# Patient Record
Sex: Male | Born: 1965 | Race: White | Hispanic: No | Marital: Married | Smoking: Never smoker
Health system: Southern US, Community
[De-identification: ages and names within clinical notes are randomized; demographics above are authoritative.]

## PROBLEM LIST (undated history)

## (undated) DIAGNOSIS — T7840XA Allergy, unspecified, initial encounter: Secondary | ICD-10-CM

## (undated) DIAGNOSIS — E785 Hyperlipidemia, unspecified: Secondary | ICD-10-CM

## (undated) DIAGNOSIS — Z0000 Encounter for general adult medical examination without abnormal findings: Secondary | ICD-10-CM

## (undated) DIAGNOSIS — H109 Unspecified conjunctivitis: Secondary | ICD-10-CM

## (undated) DIAGNOSIS — E669 Obesity, unspecified: Secondary | ICD-10-CM

## (undated) DIAGNOSIS — K439 Ventral hernia without obstruction or gangrene: Secondary | ICD-10-CM

## (undated) DIAGNOSIS — K219 Gastro-esophageal reflux disease without esophagitis: Secondary | ICD-10-CM

## (undated) DIAGNOSIS — M7711 Lateral epicondylitis, right elbow: Secondary | ICD-10-CM

## (undated) HISTORY — DX: Gastro-esophageal reflux disease without esophagitis: K21.9

## (undated) HISTORY — PX: WISDOM TOOTH EXTRACTION: SHX21

## (undated) HISTORY — DX: Unspecified conjunctivitis: H10.9

## (undated) HISTORY — DX: Ventral hernia without obstruction or gangrene: K43.9

## (undated) HISTORY — DX: Lateral epicondylitis, right elbow: M77.11

## (undated) HISTORY — PX: HERNIA REPAIR: SHX51

## (undated) HISTORY — DX: Hyperlipidemia, unspecified: E78.5

## (undated) HISTORY — DX: Allergy, unspecified, initial encounter: T78.40XA

## (undated) HISTORY — DX: Obesity, unspecified: E66.9

## (undated) HISTORY — DX: Encounter for general adult medical examination without abnormal findings: Z00.00

---

## 2007-11-26 ENCOUNTER — Observation Stay (HOSPITAL_COMMUNITY): Admission: AD | Admit: 2007-11-26 | Discharge: 2007-11-28 | Payer: Self-pay | Admitting: Internal Medicine

## 2007-11-26 ENCOUNTER — Encounter: Payer: Self-pay | Admitting: Emergency Medicine

## 2007-11-27 ENCOUNTER — Encounter (INDEPENDENT_AMBULATORY_CARE_PROVIDER_SITE_OTHER): Payer: Self-pay | Admitting: Surgery

## 2008-02-27 HISTORY — PX: CHOLECYSTECTOMY: SHX55

## 2010-07-11 NOTE — Consult Note (Signed)
Jack Stuart, Jack Stuart              ACCOUNT NO.:  [id number]   MEDICAL RECORD NO.:  [id number]          PATIENT TYPE:  INP   LOCATION:  4713                         FACILITY:  [facility]   PHYSICIAN:  Maisie Fus A. Cornett, M.D.DATE OF BIRTH:  [birth year]   DATE OF CONSULTATION:  11/26/2007  DATE OF DISCHARGE:                                 CONSULTATION   REQUESTING PHYSICIAN:  Renee Ramus, MD.   REASON FOR CONSULTATION:  Cholecystitis.   HISTORY OF PRESENT ILLNESS:  Mr. Russman is a 45 year old white male  who has a past medical history of GERD, who woke up approximately at  3:30 this morning with right upper quadrant abdominal pain.  At this  time, he did have associated nausea, however, no vomiting.  At that  time, he presented to the emergency department where he was admitted and  cardiac enzymes were taken which were negative.  At that time, then he  had an abdominal ultrasound completed which showed mild gallbladder wall  thickening, no common bile duct dilatation, and no pericholecystic  fluid.  It did show multiple gallstones, however.  At that time, this  patient was admitted to Medical Service and we were later consulted for  surgical intervention.   REVIEW OF SYSTEMS:  Please see HPI.  Otherwise all other systems are  negative.   FAMILY HISTORY:  Noncontributory.   PAST MEDICAL HISTORY:  1. Gastroesophageal reflux disease.  2. Sinus bradycardia.   PAST SURGICAL HISTORY:  Left inguinal hernia repair as a child.   SOCIAL HISTORY:  The patient does not smoke or drink any alcohol.  He is  married with one 19-year-old child and his wife is currently 8 months  pregnant.   ALLERGIES:  NKDA.   MEDICATIONS:  Nexium 40 mg a day.   PHYSICAL EXAMINATION:  GENERAL:  Mr. Pitter is a 45 year old white  male who is currently lying in bed in no acute distress.  VITAL SIGNS:  Temperature 97.5, blood pressure 111/71, pulse 52, and  respirations 20.  EYES:  Sclerae noninjected.   Pupils are equal, round, and reactive to  light.  EARS, NOSE, MOUTH, AND THROAT:  Ears and nose without any obvious masses  or lesions.  No rhinorrhea.  Mouth is pink and moist.  Throat shows no  exudate.  NECK:  Supple.  Trachea is midline.  No thyromegaly.  LUNGS:  Clear to auscultation bilaterally with no wheezes, rhonchi, or  rales noted.  Respiratory effort is nonlabored.  HEART:  Regular rate and rhythm.  Normal S1 and S2.  No murmurs,  gallops, or rubs are noted.  +2 radial, pedal, and carotid pulses  bilaterally.  ABDOMEN:  Soft with some mild right upper quadrant tenderness with a  positive Murphy sign.  Otherwise, the patient is nondistended with  active bowel sounds.  No other obvious masses or hernias are noted in  the abdomen.  MUSCULOSKELETAL:  All 4 extremities are symmetrical with no cyanosis,  clubbing, or edema.  SKIN:  Warm and dry without any obvious masses, lesions, or rashes.  NEUROLOGIC:  Cranial nerves II through XII appeared to  be grossly  intact.  PSYCH:  The patient is alert and oriented x3 with an appropriate affect.   LABORATORY AND DIAGNOSTIC DATA:  White blood cell count 9500, hemoglobin  14.9, hematocrit 42.8, and platelet count 254,000.  Sodium 139,  potassium 3.8, glucose 148, BUN 15, and creatinine 1.0.  AST 24, ALT 73,  alkaline phosphatase 67, total bilirubin 0.7, amylase is 103, and lipase  is 128.  Diagnostic ultrasound of the abdomen revealed mild gallbladder  wall distention with multiple gallstones, however, no gallstones were  seen in the common bile duct and there was no common bile duct  dilatation.  A chest x-ray was negative.   IMPRESSION:  1. Biliary colic.  2. Questionable acute cholecystitis.  3. Cholelithiasis.  4. Gastroesophageal reflux disease.   PLAN:  At this time, the patient's pain has dramatically improved.  Upon  his admission, he has been made aware that his wife who is 8 months  pregnant has been diagnosed with  thrombophlebitis and is also being  admitted to [facility] for heparin drip.  Therefore, the patient who is  as stated earlier beginning to feel better is contemplating whether he  would rather go ahead and get the surgery done now or if he needs to  wait.  Then electively he says that he can help take care of his wife as  well as his 76-year-old kid.  At this time, we have left this decision up  to the patient.  After I left the room, he is supposed to call his wife  to further make a decision.  In the meantime, if the patient does chose  to stay, we will give him a low-fat diet tonight and then make him  n.p.o. after midnight in preparation for a laparoscopic cholecystectomy  tomorrow morning.  If this is the case, we will give him Cipro IV on  call to the OR.  Otherwise, we will start Lovenox for DVT prophylaxis.  The patient will also get various other p.r.n. medications for nausea  and pain if needed.  However, as of right now, the patient has actually  not needed any pain medicines and as stated earlier he is already  beginning to feel better.  If the patient chooses to hold off on surgery  to a later, we can give him approximately a week of antibiotics and pain  medicines as needed to help with his attack now and help prevent another  attack from occurring until he can get his gallbladder taken out  electively.  Therefore, due to this dilemma, I do not have a more  definite plan at this time.  However, once the patient and his wife have  decided, we will proceed either way and we are okay with whatever  decision he makes.      Letha Cape, PA      Maisie Fus A. Cornett, M.D.  Electronically Signed    KEO/MEDQ  D:  11/26/2007  T:  11/27/2007  Job:  [id number]

## 2010-07-11 NOTE — Op Note (Signed)
Jack Stuart, Jack Stuart              ACCOUNT NO.:  [id number]   MEDICAL RECORD NO.:  [id number]          PATIENT TYPE:  INP   LOCATION:  5120                         FACILITY:  [facility]   PHYSICIAN:  Maisie Fus A. Cornett, M.D.DATE OF BIRTH:  [birth year]   DATE OF PROCEDURE:  11/27/2007  DATE OF DISCHARGE:                               OPERATIVE REPORT   PREOPERATIVE DIAGNOSIS:  Acute cholecystitis.   POSTOPERATIVE DIAGNOSIS:  Acute cholecystitis.   PROCEDURE:  Laparoscopic cholecystectomy with intraoperative  cholangiogram.   SURGEON:  Thomas A. Cornett, MD   ASSISTANT:  Kelle Darting. Rennis Harding, NP   ESTIMATED BLOOD LOSS:  100 mL.   DRAINS:  None.   ANESTHESIA:  General endotracheal anesthesia with 0.25% Sensorcaine.   SPECIMEN:  Gallbladder with gallstones to pathology.   INDICATIONS FOR PROCEDURE:  The patient is a 45 year old male who  presents today with acute cholecystitis.  I was asked to see him at the  request of Dr. Janice Norrie for this.  Upon workup, he did indeed have acute  cholecystitis and I recommend laparoscopic cholecystectomy with him  after discussing the surgical options.  He did not have any nonsurgical  options for this given that he had acute cholecystitis.  Risk of  bleeding, infection, common duct injury, and injury to other organs  including the gallbladder, liver, stomach, small bowel, and colon were  all discussed.  He understood and agreed to proceed.   DESCRIPTION OF PROCEDURE:  The patient was brought to the operating room  and placed supine.  After induction of general anesthesia, the abdomen  was prepped and draped in a sterile fashion.  He received perioperative  antibiotics.  Incision was made just below his umbilicus of about 2 cm.  Dissection was carried down to his fascia.  His fascia was opened and a  purse-string suture of 0-Vicryl was placed.  The peritoneal lining was  opened with hemostat and direct vision.  A 12-mm Hasson cannula was  placed under  direct vision and pneumoperitoneum was then created at 15  mmHg of CO2.  He was placed in reverse Trendelenburg and rolled to his  left.  After placement of this, a laparoscopy was then performed.  There  was evidence of acute cholecystitis.  No other abnormality noted.   A subxiphoid port was placed using a 11-mm trocar.  Two 5-mm ports were  placed in the right upper quadrant, all under direct vision.  Gallbladder was identified, grabbed by its dome, and retracted to the  patient's right shoulder.  Second grasper was used to grab the  infundibulum port for the patient's right lower quadrant.  Dissection  was begun at the junction of the cystic duct and infundibulum.  There  were significant fatty tissue around the infundibulum.  I was able to  dissect out the cystic duct.  Clips were placed in the gallbladder side  and a small incision was made for intraoperative cholangiogram.  A Cook  cholangiogram catheter was then placed in the cystic duct which was held  in place by clips.  Cholangiogram revealed that the tip of the catheter  was actually in the very bottom of the gallbladder.  This was then  tapered to a cystic duct which free flowed into the common duct and into  the duodenum.  There was free flow of contrast up the common hepatic  duct and the right left ductules.  We then removed the catheter.  We  placed clips on the cystic duct and divided it at this point in time.  Of note, there was some extravasation during the cholangiogram around  the catheter.  At this point, the cystic artery was identified.  I took  the individual branches up on the gallbladder itself with clips and  cautery.  We then removed the gallbladder from the gallbladder fossa  without difficulty.  There was significant fatty tissue around the  gallbladder though noted.  Gallbladder was placed in an EndoCatch bag.  Irrigate at the gallbladder bed found to be hemostatic without evidence  of bleeding or bile  leakage.  Excess irrigation was suctioned out.  We  then put the scope in subxiphoid port and extract in the gallbladder.  I  closed the fascia with a purse-string suture.  We reinspected the  gallbladder bed and right upper quadrant and saw no evidence of bleeding  or bile leakage.  We suctioned out any excess irrigation and this was  clear.  I did not see any evidence of colon injury, small bowel injury,  or other injury to liver or other intra-abdominal structures.  At this  point in time, we allowed the CO2 to escape.  Removed our ports and the  port sites showed no evidence of bleeding.  The skin was then closed  with 4-0 Monocryl.  Umbilical port site closed with the 0-Vicryl and 4-0  Monocryl.  Dermabond was applied.  All final counts of sponge, needle,  and instrument were found to be correct.  The patient was, however,  taken to recovery in satisfactory condition.      Thomas A. Cornett, M.D.  Electronically Signed     TAC/MEDQ  D:  11/27/2007  T:  11/28/2007  Job:  [id number]   cc:   Renee Ramus, MD

## 2010-11-27 LAB — COMPREHENSIVE METABOLIC PANEL
AST: 19
Albumin: 3.7
Albumin: 4.4
Alkaline Phosphatase: 67
BUN: 10
CO2: 26
CO2: 29
Calcium: 9.4
Chloride: 104
Creatinine, Ser: 0.87
GFR calc Af Amer: >60
GFR calc non Af Amer: >60
GFR calc non Af Amer: >60
Glucose, Bld: 142 — ABNORMAL HIGH
Glucose, Bld: 92
Sodium: 139
Sodium: 139
Total Bilirubin: 1.1
Total Protein: 7.3

## 2010-11-27 LAB — CBC
MCV: 90.1
Platelets: 254
RBC: 4.76
WBC: 9.5

## 2010-11-27 LAB — LIPID PANEL
Cholesterol: 217 — ABNORMAL HIGH
LDL Cholesterol: 167 — ABNORMAL HIGH
Triglycerides: 87

## 2010-11-27 LAB — CARDIAC PANEL(CRET KIN+CKTOT+MB+TROPI)
CK, MB: 0.8
CK, MB: 0.8
Relative Index: INVALID
Total CK: 82
Troponin I: <0.01
Troponin I: <0.01

## 2010-11-27 LAB — POCT CARDIAC MARKERS: CKMB, poc: 1.1

## 2010-11-27 LAB — DIFFERENTIAL
Basophils Absolute: 0.1
Basophils Relative: 1
Lymphocytes Relative: 23
Monocytes Absolute: 0.6
Neutrophils Relative %: 69

## 2010-11-27 LAB — C-REACTIVE PROTEIN: CRP: 0.1 — ABNORMAL LOW (ref <0.6)

## 2010-11-27 LAB — TSH: TSH: 1.017

## 2010-11-28 LAB — COMPREHENSIVE METABOLIC PANEL
ALT: 69 — ABNORMAL HIGH
BUN: 8
Chloride: 103
Creatinine, Ser: 1.05
GFR calc Af Amer: >60
Glucose, Bld: 123 — ABNORMAL HIGH
Potassium: 3.7
Sodium: 135
Total Bilirubin: 2.2 — ABNORMAL HIGH

## 2010-11-28 LAB — CBC
HCT: 40.1
Hemoglobin: 13.9
RDW: 12.9

## 2012-03-26 ENCOUNTER — Ambulatory Visit (INDEPENDENT_AMBULATORY_CARE_PROVIDER_SITE_OTHER): Payer: 59 | Admitting: Family Medicine

## 2012-03-26 ENCOUNTER — Other Ambulatory Visit: Payer: Self-pay | Admitting: Family Medicine

## 2012-03-26 ENCOUNTER — Encounter: Payer: Self-pay | Admitting: Family Medicine

## 2012-03-26 VITALS — BP 112/78 | HR 89 | Temp 98.4°F | Ht 74.0 in | Wt 205.0 lb

## 2012-03-26 DIAGNOSIS — E785 Hyperlipidemia, unspecified: Secondary | ICD-10-CM

## 2012-03-26 DIAGNOSIS — K219 Gastro-esophageal reflux disease without esophagitis: Secondary | ICD-10-CM

## 2012-03-26 DIAGNOSIS — K439 Ventral hernia without obstruction or gangrene: Secondary | ICD-10-CM

## 2012-03-26 DIAGNOSIS — R0989 Other specified symptoms and signs involving the circulatory and respiratory systems: Secondary | ICD-10-CM

## 2012-03-26 DIAGNOSIS — R0683 Snoring: Secondary | ICD-10-CM | POA: Insufficient documentation

## 2012-03-26 DIAGNOSIS — Z23 Encounter for immunization: Secondary | ICD-10-CM

## 2012-03-26 DIAGNOSIS — Z0000 Encounter for general adult medical examination without abnormal findings: Secondary | ICD-10-CM

## 2012-03-26 DIAGNOSIS — R197 Diarrhea, unspecified: Secondary | ICD-10-CM

## 2012-03-26 HISTORY — DX: Ventral hernia without obstruction or gangrene: K43.9

## 2012-03-26 HISTORY — DX: Encounter for general adult medical examination without abnormal findings: Z00.00

## 2012-03-26 LAB — LIPID PANEL
HDL: 38 mg/dL — ABNORMAL LOW (ref >39)
LDL Cholesterol: 140 mg/dL — ABNORMAL HIGH (ref 0–99)
Triglycerides: 175 mg/dL — ABNORMAL HIGH (ref <150)
VLDL: 35 mg/dL (ref 0–40)

## 2012-03-26 LAB — PHOSPHORUS: Phosphorus: 3.2 mg/dL (ref 2.3–4.6)

## 2012-03-26 LAB — CBC
MCH: 31.5 pg (ref 26.0–34.0)
MCHC: 35.3 g/dL (ref 30.0–36.0)
Platelets: 247 10*3/uL (ref 150–400)

## 2012-03-26 LAB — BASIC METABOLIC PANEL
Calcium: 9.5 mg/dL (ref 8.4–10.5)
Sodium: 137 mEq/L (ref 135–145)

## 2012-03-26 LAB — HEPATIC FUNCTION PANEL
ALT: 23 U/L (ref 0–53)
Bilirubin, Direct: 0.2 mg/dL (ref 0.0–0.3)
Indirect Bilirubin: 1 mg/dL — ABNORMAL HIGH (ref 0.0–0.9)

## 2012-03-26 NOTE — Assessment & Plan Note (Signed)
Has not seen an MD in many years, will check fasting labs and given a flu shot, consider Tdap at next visit.

## 2012-03-26 NOTE — Assessment & Plan Note (Signed)
Add Metamucil and probiotics, may continue Imodium prn. Consider pancreatic enzymes if diarrhea persists.

## 2012-03-26 NOTE — Patient Instructions (Addendum)
Krill oil caps daily, MegaRed caps daily Probiotic Digestive Advantage by Schiff daily Fiber, Metamucil, Benefiber are helpful   Preventive Care for Adults, Male A healthy lifestyle and preventive care can promote health and wellness. Preventive health guidelines for men include the following key practices:  A routine yearly physical is a good way to check with your caregiver about your health and preventative screening. It is a chance to share any concerns and updates on your health, and to receive a thorough exam.  Visit your dentist for a routine exam and preventative care every 6 months. Brush your teeth twice a day and floss once a day. Good oral hygiene prevents tooth decay and gum disease.  The frequency of eye exams is based on your age, health, family medical history, use of contact lenses, and other factors. Follow your caregiver's recommendations for frequency of eye exams.  Eat a healthy diet. Foods like vegetables, fruits, whole grains, low-fat dairy products, and lean protein foods contain the nutrients you need without too many calories. Decrease your intake of foods high in solid fats, added sugars, and salt. Eat the right amount of calories for you.Get information about a proper diet from your caregiver, if necessary.  Regular physical exercise is one of the most important things you can do for your health. Most adults should get at least 150 minutes of moderate-intensity exercise (any activity that increases your heart rate and causes you to sweat) each week. In addition, most adults need muscle-strengthening exercises on 2 or more days a week.  Maintain a healthy weight. The body mass index (BMI) is a screening tool to identify possible weight problems. It provides an estimate of body fat based on height and weight. Your caregiver can help determine your BMI, and can help you achieve or maintain a healthy weight.For adults 20 years and older:  A BMI below 18.5 is considered  underweight.  A BMI of 18.5 to 24.9 is normal.  A BMI of 25 to 29.9 is considered overweight.  A BMI of 30 and above is considered obese.  Maintain normal blood lipids and cholesterol levels by exercising and minimizing your intake of saturated fat. Eat a balanced diet with plenty of fruit and vegetables. Blood tests for lipids and cholesterol should begin at age 89 and be repeated every 5 years. If your lipid or cholesterol levels are high, you are over 50, or you are a high risk for heart disease, you may need your cholesterol levels checked more frequently.Ongoing high lipid and cholesterol levels should be treated with medicines if diet and exercise are not effective.  If you smoke, find out from your caregiver how to quit. If you do not use tobacco, do not start.  If you choose to drink alcohol, do not exceed 2 drinks per day. One drink is considered to be 12 ounces (355 mL) of beer, 5 ounces (148 mL) of wine, or 1.5 ounces (44 mL) of liquor.  Avoid use of street drugs. Do not share needles with anyone. Ask for help if you need support or instructions about stopping the use of drugs.  High blood pressure causes heart disease and increases the risk of stroke. Your blood pressure should be checked at least every 1 to 2 years. Ongoing high blood pressure should be treated with medicines, if weight loss and exercise are not effective.  If you are 71 to 49 years old, ask your caregiver if you should take aspirin to prevent heart disease.  Diabetes screening  involves taking a blood sample to check your fasting blood sugar level. This should be done once every 3 years, after age 55, if you are within normal weight and without risk factors for diabetes. Testing should be considered at a younger age or be carried out more frequently if you are overweight and have at least 1 risk factor for diabetes.  Colorectal cancer can be detected and often prevented. Most routine colorectal cancer screening  begins at the age of 72 and continues through age 29. However, your caregiver may recommend screening at an earlier age if you have risk factors for colon cancer. On a yearly basis, your caregiver may provide home test kits to check for hidden blood in the stool. Use of a small camera at the end of a tube, to directly examine the colon (sigmoidoscopy or colonoscopy), can detect the earliest forms of colorectal cancer. Talk to your caregiver about this at age 8, when routine screening begins. Direct examination of the colon should be repeated every 5 to 10 years through age 55, unless early forms of pre-cancerous polyps or small growths are found.  Hepatitis C blood testing is recommended for all people born from [birth year] through 1965 and any individual with known risks for hepatitis C.  Practice safe sex. Use condoms and avoid high-risk sexual practices to reduce the spread of sexually transmitted infections (STIs). STIs include gonorrhea, chlamydia, syphilis, trichomonas, herpes, HPV, and human immunodeficiency virus (HIV). Herpes, HIV, and HPV are viral illnesses that have no cure. They can result in disability, cancer, and death.  A one-time screening for abdominal aortic aneurysm (AAA) and surgical repair of large AAAs by sound wave imaging (ultrasonography) is recommended for ages 65 to 12 years who are current or former smokers.  Healthy men should no longer receive prostate-specific antigen (PSA) blood tests as part of routine cancer screening. Consult with your caregiver about prostate cancer screening.  Testicular cancer screening is not recommended for adult males who have no symptoms. Screening includes self-exam, caregiver exam, and other screening tests. Consult with your caregiver about any symptoms you have or any concerns you have about testicular cancer.  Use sunscreen with skin protection factor (SPF) of 30 or more. Apply sunscreen liberally and repeatedly throughout the day. You should  seek shade when your shadow is shorter than you. Protect yourself by wearing long sleeves, pants, a wide-brimmed hat, and sunglasses year round, whenever you are outdoors.  Once a month, do a whole body skin exam, using a mirror to look at the skin on your back. Notify your caregiver of new moles, moles that have irregular borders, moles that are larger than a pencil eraser, or moles that have changed in shape or color.  Stay current with required immunizations.  Influenza. You need a dose every fall (or winter). The composition of the flu vaccine changes each year, so being vaccinated once is not enough.  Pneumococcal polysaccharide. You need 1 to 2 doses if you smoke cigarettes or if you have certain chronic medical conditions. You need 1 dose at age 31 (or older) if you have never been vaccinated.  Tetanus, diphtheria, pertussis (Tdap, Td). Get 1 dose of Tdap vaccine if you are younger than age 71 years, are over 79 and have contact with an infant, are a Research scientist (physical sciences), or simply want to be protected from whooping cough. After that, you need a Td booster dose every 10 years. Consult your caregiver if you have not had at least 3 tetanus  and diphtheria-containing shots sometime in your life or have a deep or dirty wound.  HPV. This vaccine is recommended for males 13 through 49 years of age. This vaccine may be given to men 22 through 49 years of age who have not completed the 3 dose series. It is recommended for men through age 65 who have sex with men or whose immune system is weakened because of HIV infection, other illness, or medications. The vaccine is given in 3 doses over 6 months.  Measles, mumps, rubella (MMR). You need at least 1 dose of MMR if you were born in 1957 or later. You may also need a 2nd dose.  Meningococcal. If you are age 100 to 34 years and a Orthoptist living in a residence hall, or have one of several medical conditions, you need to get vaccinated  against meningococcal disease. You may also need additional booster doses.  Zoster (shingles). If you are age 57 years or older, you should get this vaccine.  Varicella (chickenpox). If you have never had chickenpox or you were vaccinated but received only 1 dose, talk to your caregiver to find out if you need this vaccine.  Hepatitis A. You need this vaccine if you have a specific risk factor for hepatitis A virus infection, or you simply wish to be protected from this disease. The vaccine is usually given as 2 doses, 6 to 18 months apart.  Hepatitis B. You need this vaccine if you have a specific risk factor for hepatitis B virus infection or you simply wish to be protected from this disease. The vaccine is given in 3 doses, usually over 6 months. Preventative Service / Frequency Ages 22 to 47  Blood pressure check.** / Every 1 to 2 years.  Lipid and cholesterol check.** / Every 5 years beginning at age 61.  Hepatitis C blood test.** / For any individual with known risks for hepatitis C.  Skin self-exam. / Monthly.  Influenza immunization.** / Every year.  Pneumococcal polysaccharide immunization.** / 1 to 2 doses if you smoke cigarettes or if you have certain chronic medical conditions.  Tetanus, diphtheria, pertussis (Tdap,Td) immunization. / A one-time dose of Tdap vaccine. After that, you need a Td booster dose every 10 years.  HPV immunization. / 3 doses over 6 months, if 26 and younger.  Measles, mumps, rubella (MMR) immunization. / You need at least 1 dose of MMR if you were born in 1957 or later. You may also need a 2nd dose.  Meningococcal immunization. / 1 dose if you are age 19 to 21 years and a Orthoptist living in a residence hall, or have one of several medical conditions, you need to get vaccinated against meningococcal disease. You may also need additional booster doses.  Varicella immunization.** / Consult your caregiver.  Hepatitis A  immunization.** / Consult your caregiver. 2 doses, 6 to 18 months apart.  Hepatitis B immunization.** / Consult your caregiver. 3 doses usually over 6 months. Ages 63 to 48  Blood pressure check.** / Every 1 to 2 years.  Lipid and cholesterol check.** / Every 5 years beginning at age 47.  Fecal occult blood test (FOBT) of stool. / Every year beginning at age 61 and continuing until age 44. You may not have to do this test if you get colonoscopy every 10 years.  Flexible sigmoidoscopy** or colonoscopy.** / Every 5 years for a flexible sigmoidoscopy or every 10 years for a colonoscopy beginning at age 26 and  continuing until age 43.  Hepatitis C blood test.** / For all people born from [birth year] through 1965 and any individual with known risks for hepatitis C.  Skin self-exam. / Monthly.  Influenza immunization.** / Every year.  Pneumococcal polysaccharide immunization.** / 1 to 2 doses if you smoke cigarettes or if you have certain chronic medical conditions.  Tetanus, diphtheria, pertussis (Tdap/Td) immunization.** / A one-time dose of Tdap vaccine. After that, you need a Td booster dose every 10 years.  Measles, mumps, rubella (MMR) immunization. / You need at least 1 dose of MMR if you were born in 1957 or later. You may also need a 2nd dose.  Varicella immunization.**/ Consult your caregiver.  Meningococcal immunization.** / Consult your caregiver.  Hepatitis A immunization.** / Consult your caregiver. 2 doses, 6 to 18 months apart.  Hepatitis B immunization.** / Consult your caregiver. 3 doses, usually over 6 months. Ages 58 and over  Blood pressure check.** / Every 1 to 2 years.  Lipid and cholesterol check.**/ Every 5 years beginning at age 23.  Fecal occult blood test (FOBT) of stool. / Every year beginning at age 73 and continuing until age 33. You may not have to do this test if you get colonoscopy every 10 years.  Flexible sigmoidoscopy** or colonoscopy.** / Every 5  years for a flexible sigmoidoscopy or every 10 years for a colonoscopy beginning at age 76 and continuing until age 80.  Hepatitis C blood test.** / For all people born from [birth year] through 1967 and any individual with known risks for hepatitis C.  Abdominal aortic aneurysm (AAA) screening.** / A one-time screening for ages 44 to 63 years who are current or former smokers.  Skin self-exam. / Monthly.  Influenza immunization.** / Every year.  Pneumococcal polysaccharide immunization.** / 1 dose at age 82 (or older) if you have never been vaccinated.  Tetanus, diphtheria, pertussis (Tdap, Td) immunization. / A one-time dose of Tdap vaccine if you are over 65 and have contact with an infant, are a Research scientist (physical sciences), or simply want to be protected from whooping cough. After that, you need a Td booster dose every 10 years.  Varicella immunization. ** / Consult your caregiver.  Meningococcal immunization.** / Consult your caregiver.  Hepatitis A immunization. ** / Consult your caregiver. 2 doses, 6 to 18 months apart.  Hepatitis B immunization.** / Check with your caregiver. 3 doses, usually over 6 months. **Family history and personal history of risk and conditions may change your caregiver's recommendations. Document Released: 04/10/2001 Document Revised: 05/07/2011 Document Reviewed: 07/10/2010 [facility] Patient Information 2013 [place], [place].

## 2012-03-26 NOTE — Assessment & Plan Note (Signed)
Consider sleep study, does not believe that he stops breathing, he will check with his wife and call back if so. Does struggle with fatigue

## 2012-03-26 NOTE — Progress Notes (Signed)
Patient ID: Jack Stuart, male   DOB: March 09, 1965, 47 y.o.   MRN: [id number] Odas Ozer [id number] February 07, 1966 03/26/2012      Progress Note New Patient  Subjective  Chief Complaint  Chief Complaint  Patient presents with  . Establish Care    new patient    HPI  Patient is a 47 year old Caucasian male who is in today for new patient appointment. She's not had a primary care doctor in many years. He is generally in good health but does have a few concerns. He notes having a cholecystectomy roughly 4 years ago and he's been struggling with diarrhea ever since. He notes he has numerous loose stool daily. Takes a good deal of Imodium. Does not seem to be a pattern with certain foods. No bloody or tarry stool. He also notes having trouble with intermittent epigastric pain and what feels like a hernia especially with heavy lifting and strenuous exercise ever since his surgery. He says at times it is tender and keeps him from exercising. He struggles with fatigue as well and notes that he does snore heavily. He does not believe he has actinic episodes. Denies headaches, chest pain, palpitations, shortness of breath, GI or GU concerns otherwise.  Past Medical History  Diagnosis Date  . Chicken pox as a child  . Measles as a child  . Hyperlipidemia   . Diarrhea 03/26/2012  . Snoring 03/26/2012  . Preventative health care 03/26/2012  . Abdominal wall hernia 03/26/2012  . GERD (gastroesophageal reflux disease)     Past Surgical History  Procedure Date  . Cholecystectomy 2010  . Hernia repair     as a kid    Family History  Problem Relation Age of Onset  . Heart disease Mother   . Diabetes Mother     type 2  . Glaucoma Mother   . Heart disease Father 57    open heart surgery  . Hyperlipidemia Father   . Heart disease Maternal Grandmother   . Heart disease Paternal Grandmother   . Heart disease Paternal Grandfather   . Hyperlipidemia Sister   . Hyperlipidemia Sister     History    Social History  . Marital Status: Married    Spouse Name: N/A    Number of Children: N/A  . Years of Education: N/A   Occupational History  . Not on file.   Social History Main Topics  . Smoking status: Never Smoker   . Smokeless tobacco: Never Used  . Alcohol Use: Yes     Comment: socially  . Drug Use: No  . Sexually Active: Yes   Other Topics Concern  . Not on file   Social History Narrative  . No narrative on file    Current Outpatient Prescriptions on File Prior to Visit  Medication Sig Dispense Refill  . Calcium-Magnesium-Vitamin D (CALCIUM 500 PO) Take by mouth.      Marland Kitchen omeprazole (PRILOSEC) 20 MG capsule Take 20 mg by mouth daily.        No Known Allergies  Review of Systems  Review of Systems  Constitutional: Negative for fever and malaise/fatigue.  HENT: Negative for congestion.   Eyes: Negative for discharge.  Respiratory: Negative for shortness of breath.   Cardiovascular: Negative for chest pain, palpitations and leg swelling.  Gastrointestinal: Negative for nausea, abdominal pain and diarrhea.  Genitourinary: Negative for dysuria.  Musculoskeletal: Negative for falls.  Skin: Negative for rash.  Neurological: Negative for loss of consciousness and headaches.  Endo/Heme/Allergies: Negative  for polydipsia.  Psychiatric/Behavioral: Negative for depression and suicidal ideas. The patient is not nervous/anxious and does not have insomnia.     Objective  BP 112/78  Pulse 89  Temp 98.4 F (36.9 C) (Temporal)  Ht 6\' 2"  (1.88 m)  Wt 205 lb (92.987 kg)  BMI 26.32 kg/m2  SpO2 98%  Physical Exam  Physical Exam  Constitutional: He is oriented to person, place, and time and well-developed, well-nourished, and in no distress. No distress.  HENT:  Head: Normocephalic and atraumatic.  Eyes: Conjunctivae normal are normal.  Neck: Neck supple. No thyromegaly present.  Cardiovascular: Normal rate, regular rhythm and normal heart sounds.   No murmur  heard. Pulmonary/Chest: Effort normal and breath sounds normal. No respiratory distress.  Abdominal: Soft. Bowel sounds are normal. He exhibits no distension and no mass. There is tenderness. There is no rebound and no guarding.       Tender in epigastrium with palpation, small reducible hernia noted with palpation  Musculoskeletal: He exhibits no edema.  Neurological: He is alert and oriented to person, place, and time.  Skin: Skin is warm.  Psychiatric: Memory, affect and judgment normal.       Assessment & Plan  Diarrhea Add Metamucil and probiotics, may continue Imodium prn. Consider pancreatic enzymes if diarrhea persists.  Abdominal wall hernia Has had trouble with intermittent pain in epigastrium ever since a cholecystectomy roughly 4 years ago. The pain comes and goes as the bulge comes and goes. Worse with heavy lifting. Will refer to surgery for further evaluation  GERD (gastroesophageal reflux disease) Avoid offending foods, well controlled with Omeprazole, start probiotics and consider titrating down the Omeprazole as tolerated  Hyperlipidemia Avoid trans fats, add krill oil caps. Check lipid panel  Snoring Consider sleep study, does not believe that he stops breathing, he will check with his wife and call back if so. Does struggle with fatigue  Preventative health care Has not seen an MD in many years, will check fasting labs and given a flu shot, consider Tdap at next visit.

## 2012-03-26 NOTE — Assessment & Plan Note (Signed)
Has had trouble with intermittent pain in epigastrium ever since a cholecystectomy roughly 4 years ago. The pain comes and goes as the bulge comes and goes. Worse with heavy lifting. Will refer to surgery for further evaluation

## 2012-03-26 NOTE — Assessment & Plan Note (Signed)
Avoid offending foods, well controlled with Omeprazole, start probiotics and consider titrating down the Omeprazole as tolerated

## 2012-03-26 NOTE — Assessment & Plan Note (Signed)
Avoid trans fats, add krill oil caps. Check lipid panel

## 2012-04-04 ENCOUNTER — Encounter (INDEPENDENT_AMBULATORY_CARE_PROVIDER_SITE_OTHER): Payer: Self-pay | Admitting: Surgery

## 2012-04-04 ENCOUNTER — Ambulatory Visit (INDEPENDENT_AMBULATORY_CARE_PROVIDER_SITE_OTHER): Payer: 59 | Admitting: Surgery

## 2012-04-04 VITALS — BP 122/84 | HR 68 | Temp 98.1°F | Resp 14 | Ht 74.0 in | Wt 203.4 lb

## 2012-04-04 DIAGNOSIS — K439 Ventral hernia without obstruction or gangrene: Secondary | ICD-10-CM

## 2012-04-04 NOTE — Patient Instructions (Signed)
Hernia, Surgical Repair Care After Refer to this sheet in the next few weeks. These discharge instructions provide you with general information on caring for yourself after you leave the hospital. Your caregiver may also give you specific instructions. Your treatment has been planned according to the most current medical practices available, but unavoidable complications sometimes occur. If you have any problems or questions after discharge, please call your caregiver. HOME CARE INSTRUCTIONS   It is normal to be sore for a couple weeks after surgery. See your caregiver if this seems to be getting worse rather than better.  Put ice on the operative site.  Put ice in a plastic bag.  Place a towel between your skin and the bag.  Leave the ice on for 15 to 20 minutes at a time, 3 to 4 times a day for the first 2 days.  Change bandages (dressings) as directed.  Keep the wound dry and clean. The wound may be washed gently with soap and water. Gently blot or dab the wound dry. Do not take baths, use swimming pools, or use hot tubs for 10 days, or as directed by your caregiver.  Only take over-the-counter or prescription medicines for pain, discomfort, or fever as directed by your caregiver.  Continue your normal diet as directed.  Do not drive until your caregiver says it is okay.  Do not lift anything more than 10 pounds or play contact sports for 3 weeks, or as directed.  Make an appointment to see your caregiver for stitches (sutures) or staple removal when instructed. SEEK MEDICAL CARE IF:   You have increased bleeding coming from the wounds.  You have blood in your stool.  You see redness, swelling, or have increasing pain in the wounds.  You have fluid (pus) coming from the wound.  You have an oral temperature above 102 F (38.9 C).  You notice a bad smell coming from the wound or dressing.  You develop lightheadedness or feel faint. SEEK IMMEDIATE MEDICAL CARE IF:   You  develop a rash.  You have difficulty breathing.  You develop any reaction or side effects to medicines given. MAKE SURE YOU:   Understand these instructions.  Will watch your condition.  Will get help right away if you are not doing well or get worse. Document Released: 09/01/2004 Document Revised: 05/07/2011 Document Reviewed: 01/12/2009 [facility] Patient Information 2013 [place], [place]. Hernia A hernia occurs when an internal organ pushes out through a weak spot in the abdominal wall. Hernias most commonly occur in the groin and around the navel. Hernias often can be pushed back into place (reduced). Most hernias tend to get worse over time. Some abdominal hernias can get stuck in the opening (irreducible or incarcerated hernia) and cannot be reduced. An irreducible abdominal hernia which is tightly squeezed into the opening is at risk for impaired blood supply (strangulated hernia). A strangulated hernia is a medical emergency. Because of the risk for an irreducible or strangulated hernia, surgery may be recommended to repair a hernia. CAUSES   Heavy lifting.  Prolonged coughing.  Straining to have a bowel movement.  A cut (incision) made during an abdominal surgery. HOME CARE INSTRUCTIONS   Bed rest is not required. You may continue your normal activities.  Avoid lifting more than 10 pounds (4.5 kg) or straining.  Cough gently. If you are a smoker it is best to stop. Even the best hernia repair can break down with the continual strain of coughing. Even if you  do not have your hernia repaired, a cough will continue to aggravate the problem.  Do not wear anything tight over your hernia. Do not try to keep it in with an outside bandage or truss. These can damage abdominal contents if they are trapped within the hernia sac.  Eat a normal diet.  Avoid constipation. Straining over long periods of time will increase hernia size and encourage breakdown of repairs. If you cannot do  this with diet alone, stool softeners may be used. SEEK IMMEDIATE MEDICAL CARE IF:   You have a fever.  You develop increasing abdominal pain.  You feel nauseous or vomit.  Your hernia is stuck outside the abdomen, looks discolored, feels hard, or is tender.  You have any changes in your bowel habits or in the hernia that are unusual for you.  You have increased pain or swelling around the hernia.  You cannot push the hernia back in place by applying gentle pressure while lying down. MAKE SURE YOU:   Understand these instructions.  Will watch your condition.  Will get help right away if you are not doing well or get worse. Document Released: 02/12/2005 Document Revised: 05/07/2011 Document Reviewed: 10/02/2007 [facility] Patient Information 2013 [place], [place].

## 2012-04-04 NOTE — Progress Notes (Signed)
Patient ID: Jack Stuart, male   DOB: Feb 10, 1966, 47 y.o.   MRN: [id number]  No chief complaint on file.   HPI Jack Stuart is a 47 y.o. male.  Patient sent at the request of Dr. Kevin Fenton for hernia in epigastrium. He underwent a laparoscopic cholecystectomy 2009. He noticed a small bulge at the upper incision port site in a year. He was not causing pain so he observed this. It's not causing mild discomfort. His lites to be active and is limiting his activity. He has diarrhea since his gallbladder surgery. HPI  Past Medical History  Diagnosis Date  . Chicken pox as a child  . Measles as a child  . Hyperlipidemia   . Diarrhea 03/26/2012  . Snoring 03/26/2012  . Preventative health care 03/26/2012  . Abdominal wall hernia 03/26/2012  . GERD (gastroesophageal reflux disease)     Past Surgical History  Procedure Date  . Cholecystectomy 2010  . Hernia repair     as a kid    Family History  Problem Relation Age of Onset  . Heart disease Mother   . Diabetes Mother     type 2  . Glaucoma Mother   . Heart disease Father 15    open heart surgery  . Hyperlipidemia Father   . Heart disease Maternal Grandmother   . Heart disease Paternal Grandmother   . Heart disease Paternal Grandfather   . Hyperlipidemia Sister   . Hyperlipidemia Sister     Social History History  Substance Use Topics  . Smoking status: Never Smoker   . Smokeless tobacco: Never Used  . Alcohol Use: Yes     Comment: socially    No Known Allergies  Current Outpatient Prescriptions  Medication Sig Dispense Refill  . Calcium-Magnesium-Vitamin D (CALCIUM 500 PO) Take by mouth.      . Coenzyme Q10 (CO Q 10 PO) Take by mouth daily.      . Nutritional Supplements (JUICE PLUS FIBRE PO) Take by mouth daily.      Marland Kitchen omeprazole (PRILOSEC) 20 MG capsule Take 20 mg by mouth daily.      . vitamin B-12 (CYANOCOBALAMIN) 500 MCG tablet Take 500 mcg by mouth daily.      . vitamin C (ASCORBIC ACID) 500 MG tablet Take 500 mg  by mouth daily.        Review of Systems Review of Systems  Constitutional: Negative for fever, chills and unexpected weight change.  HENT: Negative for hearing loss, congestion, sore throat, trouble swallowing and voice change.   Eyes: Negative for visual disturbance.  Respiratory: Negative for cough and wheezing.   Cardiovascular: Negative for chest pain, palpitations and leg swelling.  Gastrointestinal: Positive for diarrhea. Negative for nausea, vomiting, abdominal pain, constipation, blood in stool, abdominal distention, anal bleeding and rectal pain.  Genitourinary: Negative for hematuria and difficulty urinating.  Musculoskeletal: Negative for arthralgias.  Skin: Negative for rash and wound.  Neurological: Negative for seizures, syncope, weakness and headaches.  Hematological: Negative for adenopathy. Does not bruise/bleed easily.  Psychiatric/Behavioral: Negative for confusion.    Blood pressure 122/84, pulse 68, temperature 98.1 F (36.7 C), resp. rate 14, height 6\' 2"  (1.88 m), weight 203 lb 6.4 oz (92.262 kg).  Physical Exam Physical Exam  Constitutional: He is oriented to person, place, and time. He appears well-developed and well-nourished.  HENT:  Head: Normocephalic and atraumatic.  Eyes: EOM are normal. Pupils are equal, round, and reactive to light.  Neck: Normal range of motion. Neck supple.  Cardiovascular: Normal rate and regular rhythm.   Pulmonary/Chest: Effort normal and breath sounds normal.  Abdominal: Soft. Bowel sounds are normal.    Musculoskeletal: Normal range of motion.  Neurological: He is alert and oriented to person, place, and time.  Skin: Skin is warm and dry.  Psychiatric: He has a normal mood and affect. His behavior is normal. Judgment and thought content normal.      Assessment    Epigastric hernia reducible at previous port site from laparoscopic cholecystectomy    Plan    Repair of epigastric hernia with mesh.The risk of  hernia repair include bleeding,  Infection,   Recurrence of the hernia,  Mesh use, chronic pain,  Organ injury,  Bowel injury,  Bladder injury,   nerve injury with numbness around the incision,  Death,  and worsening of preexisting  medical problems.  The alternatives to surgery have been discussed as well..  Long term expectations of both operative and non operative treatments have been discussed.   The patient agrees to proceed.       Deryk Bozman A. 04/04/2012, 10:14 AM

## 2012-04-23 ENCOUNTER — Encounter (HOSPITAL_COMMUNITY): Payer: Self-pay | Admitting: Pharmacy Technician

## 2012-04-24 ENCOUNTER — Encounter (HOSPITAL_COMMUNITY)
Admission: RE | Admit: 2012-04-24 | Discharge: 2012-04-24 | Disposition: A | Discharge status: Home / Self Care | Payer: 59 | Source: Ambulatory Visit | Attending: Surgery | Admitting: Surgery

## 2012-04-24 ENCOUNTER — Encounter (HOSPITAL_COMMUNITY): Payer: Self-pay

## 2012-04-24 LAB — CBC
HCT: 43.6 % (ref 39.0–52.0)
MCHC: 34.4 g/dL (ref 30.0–36.0)
Platelets: 230 10*3/uL (ref 150–400)
RDW: 12.8 % (ref 11.5–15.5)
WBC: 7.4 10*3/uL (ref 4.0–10.5)

## 2012-04-24 NOTE — Patient Instructions (Addendum)
Jack Stuart  04/24/2012                           YOUR PROCEDURE IS SCHEDULED ON: 04/30/12               PLEASE REPORT TO SHORT STAY CENTER AT :  6:30 am               CALL THIS NUMBER IF ANY PROBLEMS THE DAY OF SURGERY :               [phone]                      REMEMBER:   Do not eat food or drink liquids AFTER MIDNIGHT   Take these medicines the morning of surgery with A SIP OF WATER: PRILOSEC   Do not wear jewelry, make-up   Do not wear lotions, powders, or perfumes.   Do not shave legs or underarms 12 hrs. before surgery (men may shave face)  Do not bring valuables to the hospital.  Contacts, dentures or bridgework may not be worn into surgery.  Leave suitcase in the car. After surgery it may be brought to your room.  For patients admitted to the hospital more than one night, checkout time is 11:00                          The day of discharge.   Patients discharged the day of surgery will not be allowed to drive home                             If going home same day of surgery, must have someone stay with you first                           24 hrs at home and arrange for some one to drive you home from hospital.    Special Instructions:   Please read over the following fact sheets that you were given:               1. MRSA  INFORMATION                      2. [place] PREPARING FOR SURGERY SHEET              3. STOP ASPIRIN AND HERBAL PRODUCTS 5 DAYS PREOP                                                X_____________________________________________________________________        Failure to follow these instructions may result in cancellation of your surgery

## 2012-04-30 ENCOUNTER — Encounter (HOSPITAL_COMMUNITY): Payer: Self-pay | Admitting: *Deleted

## 2012-04-30 ENCOUNTER — Encounter (HOSPITAL_COMMUNITY): Payer: Self-pay | Admitting: Anesthesiology

## 2012-04-30 ENCOUNTER — Encounter (HOSPITAL_COMMUNITY)
Admission: RE | Disposition: A | Discharge status: Home / Self Care | Payer: Self-pay | Source: Ambulatory Visit | Attending: Surgery

## 2012-04-30 ENCOUNTER — Ambulatory Visit (HOSPITAL_COMMUNITY)
Admission: RE | Admit: 2012-04-30 | Discharge: 2012-04-30 | Disposition: A | Discharge status: Home / Self Care | Payer: 59 | Source: Ambulatory Visit | Attending: Surgery | Admitting: Surgery

## 2012-04-30 ENCOUNTER — Ambulatory Visit (HOSPITAL_COMMUNITY): Payer: 59 | Admitting: Anesthesiology

## 2012-04-30 DIAGNOSIS — Z79899 Other long term (current) drug therapy: Secondary | ICD-10-CM | POA: Insufficient documentation

## 2012-04-30 DIAGNOSIS — K439 Ventral hernia without obstruction or gangrene: Secondary | ICD-10-CM

## 2012-04-30 DIAGNOSIS — R0683 Snoring: Secondary | ICD-10-CM

## 2012-04-30 DIAGNOSIS — Z0000 Encounter for general adult medical examination without abnormal findings: Secondary | ICD-10-CM

## 2012-04-30 DIAGNOSIS — R197 Diarrhea, unspecified: Secondary | ICD-10-CM

## 2012-04-30 DIAGNOSIS — K219 Gastro-esophageal reflux disease without esophagitis: Secondary | ICD-10-CM

## 2012-04-30 DIAGNOSIS — E785 Hyperlipidemia, unspecified: Secondary | ICD-10-CM

## 2012-04-30 DIAGNOSIS — Z01812 Encounter for preprocedural laboratory examination: Secondary | ICD-10-CM | POA: Insufficient documentation

## 2012-04-30 DIAGNOSIS — K432 Incisional hernia without obstruction or gangrene: Secondary | ICD-10-CM | POA: Insufficient documentation

## 2012-04-30 HISTORY — PX: EPIGASTRIC HERNIA REPAIR: SHX404

## 2012-04-30 HISTORY — PX: INSERTION OF MESH: SHX5868

## 2012-04-30 SURGERY — REPAIR, HERNIA, EPIGASTRIC, ADULT
Anesthesia: General | Site: Abdomen | Wound class: Clean

## 2012-04-30 MED ORDER — PROMETHAZINE HCL 25 MG/ML IJ SOLN: 6.2500 mg | INTRAMUSCULAR | Status: DC | PRN

## 2012-04-30 MED ORDER — OXYCODONE-ACETAMINOPHEN 5-325 MG PO TABS
1.0000 | ORAL_TABLET | ORAL | Status: DC | PRN
  Administered 2012-04-30: 1 via ORAL
  Filled 2012-04-30: qty 1

## 2012-04-30 MED ORDER — DEXAMETHASONE SODIUM PHOSPHATE 10 MG/ML IJ SOLN
INTRAMUSCULAR | Status: DC | PRN
  Administered 2012-04-30: 10 mg via INTRAVENOUS

## 2012-04-30 MED ORDER — LIDOCAINE HCL (CARDIAC) 20 MG/ML IV SOLN
INTRAVENOUS | Status: DC | PRN
  Administered 2012-04-30: 100 mg via INTRAVENOUS

## 2012-04-30 MED ORDER — ONDANSETRON HCL 4 MG/2ML IJ SOLN
INTRAMUSCULAR | Status: DC | PRN
  Administered 2012-04-30: 4 mg via INTRAVENOUS

## 2012-04-30 MED ORDER — CEFAZOLIN SODIUM-DEXTROSE 2-3 GM-% IV SOLR
2.0000 g | INTRAVENOUS | Status: AC
  Administered 2012-04-30: 2 g via INTRAVENOUS

## 2012-04-30 MED ORDER — BUPIVACAINE-EPINEPHRINE 0.25% -1:200000 IJ SOLN
INTRAMUSCULAR | Status: AC
  Filled 2012-04-30: qty 1

## 2012-04-30 MED ORDER — MIDAZOLAM HCL 5 MG/5ML IJ SOLN
INTRAMUSCULAR | Status: DC | PRN
  Administered 2012-04-30: 2 mg via INTRAVENOUS

## 2012-04-30 MED ORDER — ACETAMINOPHEN 10 MG/ML IV SOLN
INTRAVENOUS | Status: DC | PRN
  Administered 2012-04-30: 1000 mg via INTRAVENOUS

## 2012-04-30 MED ORDER — CHLORHEXIDINE GLUCONATE 4 % EX LIQD
1.0000 "application " | Freq: Once | CUTANEOUS | Status: DC
  Filled 2012-04-30: qty 15

## 2012-04-30 MED ORDER — FENTANYL CITRATE 0.05 MG/ML IJ SOLN
INTRAMUSCULAR | Status: AC
  Administered 2012-04-30: 50 ug via INTRAVENOUS
  Filled 2012-04-30: qty 2

## 2012-04-30 MED ORDER — LACTATED RINGERS IV SOLN
INTRAVENOUS | Status: DC
  Administered 2012-04-30: 1000 mL via INTRAVENOUS

## 2012-04-30 MED ORDER — ACETAMINOPHEN 10 MG/ML IV SOLN
INTRAVENOUS | Status: AC
Start: 2012-04-30 — End: 2012-04-30
  Filled 2012-04-30: qty 100

## 2012-04-30 MED ORDER — LACTATED RINGERS IV SOLN
INTRAVENOUS | Status: DC | PRN
  Administered 2012-04-30: 08:00:00 via INTRAVENOUS

## 2012-04-30 MED ORDER — FENTANYL CITRATE 0.05 MG/ML IJ SOLN: 25.0000 ug | INTRAMUSCULAR | Status: DC | PRN

## 2012-04-30 MED ORDER — CEFAZOLIN SODIUM-DEXTROSE 2-3 GM-% IV SOLR
INTRAVENOUS | Status: AC
  Filled 2012-04-30: qty 50

## 2012-04-30 MED ORDER — 0.9 % SODIUM CHLORIDE (POUR BTL) OPTIME
TOPICAL | Status: DC | PRN
  Administered 2012-04-30: 1000 mL

## 2012-04-30 MED ORDER — NEOSTIGMINE METHYLSULFATE 1 MG/ML IJ SOLN
INTRAMUSCULAR | Status: DC | PRN
  Administered 2012-04-30: 5 mg via INTRAVENOUS

## 2012-04-30 MED ORDER — OXYCODONE-ACETAMINOPHEN 5-325 MG PO TABS: 1.0000 | ORAL_TABLET | ORAL | Status: DC | PRN

## 2012-04-30 MED ORDER — MEPERIDINE HCL 50 MG/ML IJ SOLN: 6.2500 mg | INTRAMUSCULAR | Status: DC | PRN

## 2012-04-30 MED ORDER — PROPOFOL 10 MG/ML IV BOLUS
INTRAVENOUS | Status: DC | PRN
  Administered 2012-04-30: 170 mg via INTRAVENOUS

## 2012-04-30 MED ORDER — SUCCINYLCHOLINE CHLORIDE 20 MG/ML IJ SOLN
INTRAMUSCULAR | Status: DC | PRN
  Administered 2012-04-30: 100 mg via INTRAVENOUS

## 2012-04-30 MED ORDER — GLYCOPYRROLATE 0.2 MG/ML IJ SOLN
INTRAMUSCULAR | Status: DC | PRN
  Administered 2012-04-30: 0.6 mg via INTRAVENOUS

## 2012-04-30 MED ORDER — ROCURONIUM BROMIDE 100 MG/10ML IV SOLN
INTRAVENOUS | Status: DC | PRN
  Administered 2012-04-30: 20 mg via INTRAVENOUS
  Administered 2012-04-30: 10 mg via INTRAVENOUS

## 2012-04-30 MED ORDER — FENTANYL CITRATE 0.05 MG/ML IJ SOLN
INTRAMUSCULAR | Status: DC | PRN
  Administered 2012-04-30 (×2): 50 ug via INTRAVENOUS

## 2012-04-30 MED ORDER — BUPIVACAINE-EPINEPHRINE 0.25% -1:200000 IJ SOLN
INTRAMUSCULAR | Status: DC | PRN
  Administered 2012-04-30: 12 mL

## 2012-04-30 SURGICAL SUPPLY — 28 items
BENZOIN TINCTURE PRP APPL 2/3 (GAUZE/BANDAGES/DRESSINGS) ×2 IMPLANT
BINDER ABD UNIV 12 45-62 (WOUND CARE) IMPLANT
BINDER ABDOMINAL 46IN 62IN (WOUND CARE)
CANISTER SUCTION 2500CC (MISCELLANEOUS) ×2 IMPLANT
CLOTH BEACON ORANGE TIMEOUT ST (SAFETY) ×2 IMPLANT
DECANTER SPIKE VIAL GLASS SM (MISCELLANEOUS) ×2 IMPLANT
DERMABOND ADVANCED (GAUZE/BANDAGES/DRESSINGS) ×1
DERMABOND ADVANCED .7 DNX12 (GAUZE/BANDAGES/DRESSINGS) ×1 IMPLANT
DRAPE INCISE IOBAN 66X45 STRL (DRAPES) ×2 IMPLANT
DRAPE LAPAROSCOPIC ABDOMINAL (DRAPES) ×2 IMPLANT
DRAPE UTILITY XL STRL (DRAPES) ×4 IMPLANT
ELECT REM PT RETURN 9FT ADLT (ELECTROSURGICAL) ×2
ELECTRODE REM PT RTRN 9FT ADLT (ELECTROSURGICAL) ×1 IMPLANT
GLOVE BIOGEL PI IND STRL 7.0 (GLOVE) ×1 IMPLANT
GLOVE BIOGEL PI INDICATOR 7.0 (GLOVE) ×1
GOWN STRL REIN XL XLG (GOWN DISPOSABLE) ×4 IMPLANT
KIT BASIN OR (CUSTOM PROCEDURE TRAY) ×2 IMPLANT
MESH ULTRAPRO 6X6 15CM15CM (Mesh General) ×2 IMPLANT
NS IRRIG 1000ML POUR BTL (IV SOLUTION) ×2 IMPLANT
PACK GENERAL/GYN (CUSTOM PROCEDURE TRAY) ×2 IMPLANT
STRIP CLOSURE SKIN 1/2X4 (GAUZE/BANDAGES/DRESSINGS) ×4 IMPLANT
SUT MNCRL AB 4-0 PS2 18 (SUTURE) ×2 IMPLANT
SUT NOVA NAB DX-16 0-1 5-0 T12 (SUTURE) ×4 IMPLANT
SUT VIC AB 3-0 SH 27 (SUTURE) ×1
SUT VIC AB 3-0 SH 27XBRD (SUTURE) ×1 IMPLANT
SYR 30ML LL (SYRINGE) ×2 IMPLANT
TOWEL OR 17X26 10 PK STRL BLUE (TOWEL DISPOSABLE) ×2 IMPLANT
TOWEL OR NON WOVEN STRL DISP B (DISPOSABLE) ×2 IMPLANT

## 2012-04-30 NOTE — Interval H&P Note (Signed)
History and Physical Interval Note:  04/30/2012 9:06 AM  Jack Stuart  has presented today for surgery, with the diagnosis of epigastric hernia   The various methods of treatment have been discussed with the patient and family. After consideration of risks, benefits and other options for treatment, the patient has consented to  Procedure(s): HERNIA REPAIR EPIGASTRIC ADULT (N/A) INSERTION OF MESH (N/A) as a surgical intervention .  The patient's history has been reviewed, patient examined, no change in status, stable for surgery.  I have reviewed the patient's chart and labs.  Questions were answered to the patient's satisfaction.     CORNETT,THOMAS A.

## 2012-04-30 NOTE — H&P (View-Only) (Signed)
Patient ID: Jack Stuart, male   DOB: 12/28/1965, 47 y.o.   MRN: [id number]  No chief complaint on file.   HPI Jack Stuart is a 47 y.o. male.  Patient sent at the request of Dr. Blithe for hernia in epigastrium. He underwent a laparoscopic cholecystectomy 2009. He noticed a small bulge at the upper incision port site in a year. He was not causing pain so he observed this. It's not causing mild discomfort. His lites to be active and is limiting his activity. He has diarrhea since his gallbladder surgery. HPI  Past Medical History  Diagnosis Date  . Chicken pox as a child  . Measles as a child  . Hyperlipidemia   . Diarrhea 03/26/2012  . Snoring 03/26/2012  . Preventative health care 03/26/2012  . Abdominal wall hernia 03/26/2012  . GERD (gastroesophageal reflux disease)     Past Surgical History  Procedure Date  . Cholecystectomy 2010  . Hernia repair     as a kid    Family History  Problem Relation Age of Onset  . Heart disease Mother   . Diabetes Mother     type 2  . Glaucoma Mother   . Heart disease Father 55    open heart surgery  . Hyperlipidemia Father   . Heart disease Maternal Grandmother   . Heart disease Paternal Grandmother   . Heart disease Paternal Grandfather   . Hyperlipidemia Sister   . Hyperlipidemia Sister     Social History History  Substance Use Topics  . Smoking status: Never Smoker   . Smokeless tobacco: Never Used  . Alcohol Use: Yes     Comment: socially    No Known Allergies  Current Outpatient Prescriptions  Medication Sig Dispense Refill  . Calcium-Magnesium-Vitamin D (CALCIUM 500 PO) Take by mouth.      . Coenzyme Q10 (CO Q 10 PO) Take by mouth daily.      . Nutritional Supplements (JUICE PLUS FIBRE PO) Take by mouth daily.      . omeprazole (PRILOSEC) 20 MG capsule Take 20 mg by mouth daily.      . vitamin B-12 (CYANOCOBALAMIN) 500 MCG tablet Take 500 mcg by mouth daily.      . vitamin C (ASCORBIC ACID) 500 MG tablet Take 500 mg  by mouth daily.        Review of Systems Review of Systems  Constitutional: Negative for fever, chills and unexpected weight change.  HENT: Negative for hearing loss, congestion, sore throat, trouble swallowing and voice change.   Eyes: Negative for visual disturbance.  Respiratory: Negative for cough and wheezing.   Cardiovascular: Negative for chest pain, palpitations and leg swelling.  Gastrointestinal: Positive for diarrhea. Negative for nausea, vomiting, abdominal pain, constipation, blood in stool, abdominal distention, anal bleeding and rectal pain.  Genitourinary: Negative for hematuria and difficulty urinating.  Musculoskeletal: Negative for arthralgias.  Skin: Negative for rash and wound.  Neurological: Negative for seizures, syncope, weakness and headaches.  Hematological: Negative for adenopathy. Does not bruise/bleed easily.  Psychiatric/Behavioral: Negative for confusion.    Blood pressure 122/84, pulse 68, temperature 98.1 F (36.7 C), resp. rate 14, height 6' 2" (1.88 m), weight 203 lb 6.4 oz (92.262 kg).  Physical Exam Physical Exam  Constitutional: He is oriented to person, place, and time. He appears well-developed and well-nourished.  HENT:  Head: Normocephalic and atraumatic.  Eyes: EOM are normal. Pupils are equal, round, and reactive to light.  Neck: Normal range of motion. Neck supple.    Cardiovascular: Normal rate and regular rhythm.   Pulmonary/Chest: Effort normal and breath sounds normal.  Abdominal: Soft. Bowel sounds are normal.    Musculoskeletal: Normal range of motion.  Neurological: He is alert and oriented to person, place, and time.  Skin: Skin is warm and dry.  Psychiatric: He has a normal mood and affect. His behavior is normal. Judgment and thought content normal.      Assessment    Epigastric hernia reducible at previous port site from laparoscopic cholecystectomy    Plan    Repair of epigastric hernia with mesh.The risk of  hernia repair include bleeding,  Infection,   Recurrence of the hernia,  Mesh use, chronic pain,  Organ injury,  Bowel injury,  Bladder injury,   nerve injury with numbness around the incision,  Death,  and worsening of preexisting  medical problems.  The alternatives to surgery have been discussed as well..  Long term expectations of both operative and non operative treatments have been discussed.   The patient agrees to proceed.       Addam Goeller A. 04/04/2012, 10:14 AM    

## 2012-04-30 NOTE — Transfer of Care (Signed)
Immediate Anesthesia Transfer of Care Note  Patient: Jack Stuart  Procedure(s) Performed: Procedure(s): HERNIA REPAIR EPIGASTRIC ADULT (N/A) INSERTION OF MESH (N/A)  Patient Location: PACU  Anesthesia Type:General  Level of Consciousness: sedated  Airway & Oxygen Therapy: Patient Spontanous Breathing and Patient connected to face mask oxygen  Post-op Assessment: Report given to PACU RN and Post -op Vital signs reviewed and stable  Post vital signs: Reviewed and stable  Complications: No apparent anesthesia complications

## 2012-04-30 NOTE — Anesthesia Preprocedure Evaluation (Addendum)

## 2012-04-30 NOTE — Op Note (Signed)
NAMEWEI, POPLASKI              ACCOUNT NO.:  [id number]  MEDICAL RECORD NO.:  [id number]  LOCATION:  [facility]                         FACILITY:  [facility]  PHYSICIAN:  Maura Braaten A. Johnta Couts, M.D.DATE OF BIRTH:  1965/09/23  DATE OF PROCEDURE:  04/30/2012 DATE OF DISCHARGE:  04/30/2012                              OPERATIVE REPORT   PREOPERATIVE DIAGNOSIS:  Epigastric hernia at previous laparoscopic port site.  POSTOPERATIVE DIAGNOSIS:  Epigastric hernia at previous laparoscopic port site with significant attenuation of the fascia at the port site.  PROCEDURE:  Repair of epigastric hernia with UltraPro mesh in a submuscular position measuring 8 x 8 cm.  SURGEON:  Maisie Fus A. Shontavia Mickel, MD  ANESTHESIA:  General endotracheal anesthesia, 0.25% Sensorcaine local.  EBL:  Minimal.  SPECIMENS:  None.  INDICATIONS FOR PROCEDURE:  The patient presents with a 1-cm epigastric hernia by physical examination, had his previous laparoscopic cholecystectomy port site.  This is causing discomfort and he wished to have repaired.  Risk of procedures were discussed of bleeding, infection, organ injury, the need for further operations, mesh infection, mesh migration.  He understood the above and agreed to proceed.  DESCRIPTION OF PROCEDURE:  The patient was met in the holding area, questions were answered.  He was then taken back to the operating room, placed supine on the operating room table, where his abdomen was then prepped and draped in sterile fashion.  The site of the hernia was the previous port site from his laparoscopic cholecystectomy in the epigastrium.  This was marked.  After time-out was done, he received 2 g of Ancef.  I made a vertical incision in the midline just below the xiphoid process through the previous incision to give better exposure to the area.  Dissection was carried down and the rectus muscles were identified.  I was then able to palpate this area.  There was a large hernia  defect, but was extremely attenuated and thinned measuring about 2 cm.  I was able to then open the posterior sheath of the rectus muscle and entered the preperitoneal space.  I used my finger, then swept around circumferentially from mid xiphoid process to up under the costal margin on the right laterally 5 cm to his right and then I went inferior to this area in the preperitoneal space, about 5 cm and then went 5 more cm to his left opening this entire preperitoneal space.  The area was weak, but the rest of the fascia felt intact and I felt no other defect. I felt that placing a piece of UltraPro mesh in the submuscular position here and securing it, then closing the defect over that would be a good repair.  The mesh was going to overlaid this by 4-5 cm in all directions, and I felt this would cover the area of weakness quite nicely.  The mesh was then cut in a circular fashion.  It was placed in the preperitoneal space and then laid out flat.  Four tacking sutures were used to tack it with transfascial sutures brought this out about 3 cm from the fascial edge circumferentially.  Once these 4 sutures were pulled up, tied, the mesh was then secured.  I then closed the muscle fascia using #1 Novafil suture over the defect.  3-0 Vicryl was used to close the subcutaneous fat and 4-0 Monocryl was used to close the skin in subcuticular fashion.  Dermabond applied.  All final counts of sponge, needle, and instruments found to be correct for this portion of the case.  Dermabond was applied.     Alisa Stjames A. Heydi Swango, M.D.     TAC/MEDQ  D:  04/30/2012  T:  04/30/2012  Job:  [id number]

## 2012-04-30 NOTE — Anesthesia Postprocedure Evaluation (Signed)
  Anesthesia Post-op Note  Patient: Jack Stuart  Procedure(s) Performed: Procedure(s) (LRB): HERNIA REPAIR EPIGASTRIC ADULT (N/A) INSERTION OF MESH (N/A)  Patient Location: PACU  Anesthesia Type: General  Level of Consciousness: awake and alert   Airway and Oxygen Therapy: Patient Spontanous Breathing  Post-op Pain: mild  Post-op Assessment: Post-op Vital signs reviewed, Patient's Cardiovascular Status Stable, Respiratory Function Stable, Patent Airway and No signs of Nausea or vomiting  Last Vitals:  Filed Vitals:   04/30/12 1149  BP: 112/72  Pulse: 62  Temp:   Resp: 16    Post-op Vital Signs: stable   Complications: No apparent anesthesia complications

## 2012-04-30 NOTE — Brief Op Note (Signed)
04/30/2012  10:18 AM  PATIENT:  Jack Stuart  47 y.o. male  PRE-OPERATIVE DIAGNOSIS:  epigastric hernia   POST-OPERATIVE DIAGNOSIS:  epigastric hernia   PROCEDURE:  Procedure(s): HERNIA REPAIR EPIGASTRIC ADULT (N/A) INSERTION OF MESH (N/A)  SURGEON:  Surgeon(s) and Role:    * Thomas A. Cornett, MD - Primary   ASSISTANTS: none   ANESTHESIA:   local and general  EBL:  Total I/O In: 900 [I.V.:900] Out: -   BLOOD ADMINISTERED:none  DRAINS: none   LOCAL MEDICATIONS USED:  BUPIVICAINE   SPECIMEN:  No Specimen  DISPOSITION OF SPECIMEN:  N/A  COUNTS:  YES  TOURNIQUET:  * No tourniquets in log *  DICTATION: .Other Dictation: Dictation Number [id number]  PLAN OF CARE: Discharge to home after PACU  PATIENT DISPOSITION:  PACU - hemodynamically stable.   Delay start of Pharmacological VTE agent (>24hrs) due to surgical blood loss or risk of bleeding: not applicable

## 2012-05-01 ENCOUNTER — Telehealth (INDEPENDENT_AMBULATORY_CARE_PROVIDER_SITE_OTHER): Payer: Self-pay | Admitting: General Surgery

## 2012-05-01 ENCOUNTER — Encounter (HOSPITAL_COMMUNITY): Payer: Self-pay | Admitting: Surgery

## 2012-05-01 NOTE — Telephone Encounter (Signed)
Spoke with patient he has appt 05/23/12 9:10 am . He also stated he had  A episode of vomiting late yesterday evening after eating . I explained to him this can be common after surgery . I also advised if he had any more episodes to contact our office

## 2012-05-23 ENCOUNTER — Ambulatory Visit (INDEPENDENT_AMBULATORY_CARE_PROVIDER_SITE_OTHER): Payer: 59 | Admitting: Surgery

## 2012-05-23 ENCOUNTER — Encounter (INDEPENDENT_AMBULATORY_CARE_PROVIDER_SITE_OTHER): Payer: Self-pay | Admitting: Surgery

## 2012-05-23 VITALS — BP 124/74 | HR 76 | Temp 97.8°F | Resp 18 | Ht 74.0 in | Wt 205.8 lb

## 2012-05-23 DIAGNOSIS — Z9889 Other specified postprocedural states: Secondary | ICD-10-CM

## 2012-05-23 NOTE — Patient Instructions (Signed)
Resume activity slowly as tolerated.  Return as needed.

## 2012-05-23 NOTE — Progress Notes (Signed)
Pt returns today after epigastric  hernia repair.  Pain is well controlled.  Bowels are functioning.  Wound is clean.  On exam:  Incision is clean /dry/intact.  Area is soft without signs of hernia recurrence.  Impression:  Status repair of hernia  Plan:  RTC PRN  Return to full activity as tolerated.

## 2012-06-25 ENCOUNTER — Ambulatory Visit (INDEPENDENT_AMBULATORY_CARE_PROVIDER_SITE_OTHER): Payer: 59 | Admitting: Family Medicine

## 2012-06-25 ENCOUNTER — Encounter: Payer: Self-pay | Admitting: Family Medicine

## 2012-06-25 VITALS — BP 98/68 | HR 75 | Temp 97.9°F | Ht 74.0 in | Wt 204.1 lb

## 2012-06-25 DIAGNOSIS — R197 Diarrhea, unspecified: Secondary | ICD-10-CM

## 2012-06-25 DIAGNOSIS — E785 Hyperlipidemia, unspecified: Secondary | ICD-10-CM

## 2012-06-25 DIAGNOSIS — K439 Ventral hernia without obstruction or gangrene: Secondary | ICD-10-CM

## 2012-06-25 DIAGNOSIS — K219 Gastro-esophageal reflux disease without esophagitis: Secondary | ICD-10-CM

## 2012-06-25 NOTE — Patient Instructions (Addendum)
DIgestive Advantage probiotics daily Calcium/Magnesium/Zinc 1 tab po daily Fiber daily, Benefiber or Metamucil    amounts. The liver makes all the cholesterol you need. It is carried from the liver by the blood through the blood vessels. Deposits (plaque) may build up on blood vessel walls. This makes the arteries narrower and stiffer. Plaque increases the risk for heart attack and stroke. You cannot feel your cholesterol level even if it is very high. The only way to know is by a blood test to check your lipid (fats) levels. Once you know your cholesterol levels, you should keep a record of the test results. Work with your caregiver to to keep your levels in the desired range. WHAT THE RESULTS MEAN:  Total cholesterol is a rough measure of all the cholesterol in your blood.  LDL is the so-called bad cholesterol. This is the type that deposits cholesterol in the walls of the arteries. You want this level to be low.  HDL is the good cholesterol because it cleans the arteries and carries the LDL away. You want this level to be high.  Triglycerides are fat that the body can either burn for energy or store. High levels are closely linked to heart disease. DESIRED LEVELS:  Total cholesterol below 200.  LDL below 100 for people at risk, below 70 for very high risk.  HDL above 50 is good, above 60 is best.  Triglycerides below 150. HOW TO LOWER YOUR CHOLESTEROL:  Diet.  Choose fish or white meat chicken and [place], roasted or baked. Limit fatty cuts of red meat, fried foods, and processed meats, such as sausage and lunch meat.  Eat lots of fresh fruits and vegetables. Choose whole grains, beans, pasta, potatoes and cereals.  Use only small amounts of olive, corn or canola oils. Avoid butter, mayonnaise, shortening or palm kernel oils. Avoid foods with trans-fats.  Use skim/nonfat milk and low-fat/nonfat yogurt and cheeses. Avoid whole milk, cream, ice cream, egg yolks and cheeses. Healthy  desserts include angel food cake, ginger snaps, animal crackers, hard candy, popsicles, and low-fat/nonfat frozen yogurt. Avoid pastries, cakes, pies and cookies.  Exercise.  A regular program helps decrease LDL and raises HDL.  Helps with weight control.  Do things that increase your activity level like gardening, walking, or taking the stairs.  Medication.  May be prescribed by your caregiver to help lowering cholesterol and the risk for heart disease.  You may need medicine even if your levels are normal if you have several risk factors. HOME CARE INSTRUCTIONS   Follow your diet and exercise programs as suggested by your caregiver.  Take medications as directed.  Have blood work done when your caregiver feels it is necessary. MAKE SURE YOU:   Understand these instructions.  Will watch your condition.  Will get help right away if you are not doing well or get worse. Document Released: 11/07/2000 Document Revised: 05/07/2011 Document Reviewed: 04/30/2007 [facility] Patient Information 2013 [place], [place].

## 2012-06-28 NOTE — Assessment & Plan Note (Signed)
Mild, avoid trans fats, increase exercise, start krill oil caps daily

## 2012-06-28 NOTE — Assessment & Plan Note (Signed)
Has been repaired since last visit and he tolerated that well

## 2012-06-28 NOTE — Assessment & Plan Note (Signed)
Well controlled with dietary changes and Prilosec prn

## 2012-06-28 NOTE — Assessment & Plan Note (Signed)
Improved encouraged probiotic and fiber.

## 2012-06-28 NOTE — Progress Notes (Signed)
Patient ID: Jack Stuart, male   DOB: [birth year], 47 y.o.   MRN: [id number] Jack Stuart [id number] 07/27/1965 06/28/2012      Progress Note-Follow Up  Subjective  Chief Complaint  Chief Complaint  Patient presents with  . Follow-up    3 month    HPI  Patient is a 47 year old Caucasian male who is in today for followup he has had abdominal wall hernia repair since his last visit and tolerated that well. He reports his bowels are moving well now although he did have trouble right after surgery. No diarrhea at this time. No fevers or chills. No other acute illness or complaints. No chest pain, palpitations, shortness of breath, GI or GU concerns are noted.  Past Medical History  Diagnosis Date  . Hyperlipidemia   . Preventative health care 03/26/2012  . Abdominal wall hernia 03/26/2012  . GERD (gastroesophageal reflux disease)     Past Surgical History  Procedure Laterality Date  . Cholecystectomy  2010  . Hernia repair      as a kid  . Wisdom tooth extraction    . Epigastric hernia repair N/A 04/30/2012    Procedure: HERNIA REPAIR EPIGASTRIC ADULT;  Surgeon: Clovis Pu. Cornett, MD;  Location: [facility];  Service: General;  Laterality: N/A;  . Insertion of mesh N/A 04/30/2012    Procedure: INSERTION OF MESH;  Surgeon: Clovis Pu. Cornett, MD;  Location: [facility];  Service: General;  Laterality: N/A;    Family History  Problem Relation Age of Onset  . Heart disease Mother   . Diabetes Mother     type 2  . Glaucoma Mother   . Heart disease Father 93    open heart surgery  . Hyperlipidemia Father   . Heart disease Maternal Grandmother   . Heart disease Paternal Grandmother   . Heart disease Paternal Grandfather   . Hyperlipidemia Sister   . Hyperlipidemia Sister     History   Social History  . Marital Status: Married    Spouse Name: N/A    Number of Children: N/A  . Years of Education: N/A   Occupational History  . Not on file.   Social History Main Topics  . Smoking  status: Never Smoker   . Smokeless tobacco: Never Used  . Alcohol Use: Yes     Comment: socially  . Drug Use: No  . Sexually Active: Yes   Other Topics Concern  . Not on file   Social History Narrative  . No narrative on file    Current Outpatient Prescriptions on File Prior to Visit  Medication Sig Dispense Refill  . Calcium-Magnesium-Vitamin D (CALCIUM 500 PO) Take 1 tablet by mouth daily.       . Coenzyme Q10 (CO Q 10 PO) Take 1 tablet by mouth daily.       Marland Kitchen KRILL OIL PO Take 1 tablet by mouth daily.      . Nutritional Supplements (JUICE PLUS FIBRE PO) Take 2 tablets by mouth daily.       Marland Kitchen omeprazole (PRILOSEC) 20 MG capsule Take 20 mg by mouth daily.      . vitamin B-12 (CYANOCOBALAMIN) 500 MCG tablet Take 500 mcg by mouth daily.      . vitamin C (ASCORBIC ACID) 500 MG tablet Take 500 mg by mouth daily.       No current facility-administered medications on file prior to visit.    No Known Allergies  Review of Systems  Review of Systems  Constitutional:  Negative for fever and malaise/fatigue.  HENT: Negative for congestion.   Eyes: Negative for discharge.  Respiratory: Negative for shortness of breath.   Cardiovascular: Negative for chest pain, palpitations and leg swelling.  Gastrointestinal: Negative for nausea, abdominal pain and diarrhea.  Genitourinary: Negative for dysuria.  Musculoskeletal: Negative for falls.  Skin: Negative for rash.  Neurological: Negative for loss of consciousness and headaches.  Endo/Heme/Allergies: Negative for polydipsia.  Psychiatric/Behavioral: Negative for depression and suicidal ideas. The patient is not nervous/anxious and does not have insomnia.     Objective  BP 98/68  Pulse 75  Temp(Src) 97.9 F (36.6 C) (Temporal)  Ht 6\' 2"  (1.88 m)  Wt 204 lb 1.9 oz (92.588 kg)  BMI 26.2 kg/m2  SpO2 98%  Physical Exam  Physical Exam  Constitutional: He is oriented to person, place, and time and well-developed, well-nourished,  and in no distress. No distress.  HENT:  Head: Normocephalic and atraumatic.  Eyes: Conjunctivae are normal.  Neck: Neck supple. No thyromegaly present.  Cardiovascular: Normal rate, regular rhythm and normal heart sounds.   No murmur heard. Pulmonary/Chest: Effort normal and breath sounds normal. No respiratory distress.  Abdominal: He exhibits no distension and no mass. There is no tenderness.  Musculoskeletal: He exhibits no edema.  Neurological: He is alert and oriented to person, place, and time.  Skin: Skin is warm.  Psychiatric: Memory, affect and judgment normal.    Lab Results  Component Value Date   TSH 1.217 03/26/2012   Lab Results  Component Value Date   WBC 7.4 04/24/2012   HGB 15.0 04/24/2012   HCT 43.6 04/24/2012   MCV 92.2 04/24/2012   PLT 230 04/24/2012   Lab Results  Component Value Date   CREATININE 0.86 03/26/2012   BUN 12 03/26/2012   NA 137 03/26/2012   K 4.0 03/26/2012   CL 103 03/26/2012   CO2 25 03/26/2012   Lab Results  Component Value Date   ALT 23 03/26/2012   AST 16 03/26/2012   ALKPHOS 59 03/26/2012   BILITOT 1.2 03/26/2012   Lab Results  Component Value Date   CHOL 213* 03/26/2012   Lab Results  Component Value Date   HDL 38* 03/26/2012   Lab Results  Component Value Date   LDLCALC 140* 03/26/2012   Lab Results  Component Value Date   TRIG 175* 03/26/2012   Lab Results  Component Value Date   CHOLHDL 5.6 03/26/2012     Assessment & Plan  Hernia of abdominal wall Has been repaired since last visit and he tolerated that well  Hyperlipidemia Mild, avoid trans fats, increase exercise, start krill oil caps daily  GERD (gastroesophageal reflux disease) Well controlled with dietary changes and Prilosec prn  Diarrhea Improved encouraged probiotic and fiber.

## 2012-10-21 ENCOUNTER — Other Ambulatory Visit (INDEPENDENT_AMBULATORY_CARE_PROVIDER_SITE_OTHER): Payer: 59

## 2012-10-21 DIAGNOSIS — E785 Hyperlipidemia, unspecified: Secondary | ICD-10-CM

## 2012-10-21 LAB — LIPID PANEL
Cholesterol: 190 mg/dL (ref 0–200)
HDL: 39 mg/dL — ABNORMAL LOW (ref >39.00)
Total CHOL/HDL Ratio: 5
Triglycerides: 173 mg/dL — ABNORMAL HIGH (ref 0.0–149.0)

## 2012-10-21 LAB — CBC WITH DIFFERENTIAL/PLATELET
Basophils Absolute: 0 10*3/uL (ref 0.0–0.1)
HCT: 43.8 % (ref 39.0–52.0)
Hemoglobin: 15 g/dL (ref 13.0–17.0)
Lymphs Abs: 2 10*3/uL (ref 0.7–4.0)
MCHC: 34.2 g/dL (ref 30.0–36.0)
MCV: 92.8 fl (ref 78.0–100.0)
Monocytes Absolute: 0.5 10*3/uL (ref 0.1–1.0)
Neutro Abs: 4.3 10*3/uL (ref 1.4–7.7)
Platelets: 233 10*3/uL (ref 150.0–400.0)
RDW: 13.5 % (ref 11.5–14.6)

## 2012-10-21 LAB — RENAL FUNCTION PANEL
Albumin: 4.1 g/dL (ref 3.5–5.2)
BUN: 13 mg/dL (ref 6–23)
CO2: 26 mEq/L (ref 19–32)
Calcium: 9.5 mg/dL (ref 8.4–10.5)
Chloride: 103 mEq/L (ref 96–112)
Creatinine, Ser: 1 mg/dL (ref 0.4–1.5)

## 2012-10-21 LAB — HEPATIC FUNCTION PANEL
AST: 19 U/L (ref 0–37)
Albumin: 4.1 g/dL (ref 3.5–5.2)
Total Bilirubin: 1.4 mg/dL — ABNORMAL HIGH (ref 0.3–1.2)

## 2012-10-21 NOTE — Progress Notes (Signed)
Labs only

## 2012-10-30 ENCOUNTER — Ambulatory Visit (INDEPENDENT_AMBULATORY_CARE_PROVIDER_SITE_OTHER): Payer: 59 | Admitting: Family Medicine

## 2012-10-30 ENCOUNTER — Encounter: Payer: Self-pay | Admitting: Family Medicine

## 2012-10-30 VITALS — BP 90/70 | HR 69 | Temp 97.9°F | Ht 74.0 in | Wt 204.0 lb

## 2012-10-30 DIAGNOSIS — K219 Gastro-esophageal reflux disease without esophagitis: Secondary | ICD-10-CM

## 2012-10-30 DIAGNOSIS — R0989 Other specified symptoms and signs involving the circulatory and respiratory systems: Secondary | ICD-10-CM

## 2012-10-30 DIAGNOSIS — E785 Hyperlipidemia, unspecified: Secondary | ICD-10-CM

## 2012-10-30 DIAGNOSIS — R197 Diarrhea, unspecified: Secondary | ICD-10-CM

## 2012-10-30 DIAGNOSIS — R0683 Snoring: Secondary | ICD-10-CM

## 2012-10-30 DIAGNOSIS — R0609 Other forms of dyspnea: Secondary | ICD-10-CM

## 2012-10-30 DIAGNOSIS — Z23 Encounter for immunization: Secondary | ICD-10-CM

## 2012-10-30 NOTE — Patient Instructions (Addendum)
Diphenhydramine/Benadryl 25 mg tabs po at bedtime for insomnia Melatonin 5- 10 mg at bedtime  Stretch, ice Salon Pas or Aspercreme [place], Riley or Teva sandels  Dr [employer] inserts at [employer] Advantage or Align,  Calcium Carbonate Vitamin B complex Zinc 50 mg daily Insomnia Insomnia is frequent trouble falling and/or staying asleep. Insomnia can be a long term problem or a short term problem. Both are common. Insomnia can be a short term problem when the wakefulness is related to a certain stress or worry. Long term insomnia is often related to ongoing stress during waking hours and/or poor sleeping habits. Overtime, sleep deprivation itself can make the problem worse. Every little thing feels more severe because you are overtired and your ability to cope is decreased. CAUSES   Stress, anxiety, and depression.  Poor sleeping habits.  Distractions such as TV in the bedroom.  Naps close to bedtime.  Engaging in emotionally charged conversations before bed.  Technical reading before sleep.  Alcohol and other sedatives. They may make the problem worse. They can hurt normal sleep patterns and normal dream activity.  Stimulants such as caffeine for several hours prior to bedtime.  Pain syndromes and shortness of breath can cause insomnia.  Exercise late at night.  Changing time zones may cause sleeping problems (jet lag). It is sometimes helpful to have someone observe your sleeping patterns. They should look for periods of not breathing during the night (sleep apnea). They should also look to see how long those periods last. If you live alone or observers are uncertain, you can also be observed at a sleep clinic where your sleep patterns will be professionally monitored. Sleep apnea requires a checkup and treatment. Give your caregivers your medical history. Give your caregivers observations your family has made about your sleep.  SYMPTOMS   Not feeling  rested in the morning.  Anxiety and restlessness at bedtime.  Difficulty falling and staying asleep. TREATMENT   Your caregiver may prescribe treatment for an underlying medical disorders. Your caregiver can give advice or help if you are using alcohol or other drugs for self-medication. Treatment of underlying problems will usually eliminate insomnia problems.  Medications can be prescribed for short time use. They are generally not recommended for lengthy use.  Over-the-counter sleep medicines are not recommended for lengthy use. They can be habit forming.  You can promote easier sleeping by making lifestyle changes such as:  Using relaxation techniques that help with breathing and reduce muscle tension.  Exercising earlier in the day.  Changing your diet and the time of your last meal. No night time snacks.  Establish a regular time to go to bed.  Counseling can help with stressful problems and worry.  Soothing music and white noise may be helpful if there are background noises you cannot remove.  Stop tedious detailed work at least one hour before bedtime. HOME CARE INSTRUCTIONS   Keep a diary. Inform your caregiver about your progress. This includes any medication side effects. See your caregiver regularly. Take note of:  Times when you are asleep.  Times when you are awake during the night.  The quality of your sleep.  How you feel the next day. This information will help your caregiver care for you.  Get out of bed if you are still awake after 15 minutes. Read or do some quiet activity. Keep the lights down. Wait until you feel sleepy and go back to bed.  Keep regular sleeping and waking hours.  Avoid naps.  Exercise regularly.  Avoid distractions at bedtime. Distractions include watching television or engaging in any intense or detailed activity like attempting to balance the household checkbook.  Develop a bedtime ritual. Keep a familiar routine of bathing,  brushing your teeth, climbing into bed at the same time each night, listening to soothing music. Routines increase the success of falling to sleep faster.  Use relaxation techniques. This can be using breathing and muscle tension release routines. It can also include visualizing peaceful scenes. You can also help control troubling or intruding thoughts by keeping your mind occupied with boring or repetitive thoughts like the old concept of counting sheep. You can make it more creative like imagining planting one beautiful flower after another in your backyard garden.  During your day, work to eliminate stress. When this is not possible use some of the previous suggestions to help reduce the anxiety that accompanies stressful situations. MAKE SURE YOU:   Understand these instructions.  Will watch your condition.  Will get help right away if you are not doing well or get worse. Document Released: 02/10/2000 Document Revised: 05/07/2011 Document Reviewed: 03/12/2007 [facility] Patient Information 2014 [place], [place].

## 2012-11-02 ENCOUNTER — Encounter: Payer: Self-pay | Admitting: Family Medicine

## 2012-11-02 NOTE — Progress Notes (Signed)
Patient ID: Jack Stuart, male   DOB: 08-25-1965, 47 y.o.   MRN: [id number] Refugio Vandevoorde [id number] 05/27/1965 11/02/2012      Progress Note-Follow Up  Subjective  Chief Complaint  Chief Complaint  Patient presents with  . Follow-up    4 week  . Injections    flu    HPI  Patient is a 47 year old Caucasian male in today for followup. He continues to snore but less so and is having worsening fitful sleep. He falls asleep between 10 and 11 and wakes up between 3 and 5 and is unable to return to sleep. As a result he is struggling with fatigue. He is having worsening diarrhea. No bloody or tarry stool but does have some abdominal discomfort at times. This has been worsening over the last 2 months. Heartburn persists although is improved with omeprazole and chest pain or palpitations. Does have a great deal of work stress. No shortness of breath or GU complaints noted. Taking medications as prescribed  Past Medical History  Diagnosis Date  . Hyperlipidemia   . Preventative health care 03/26/2012  . Abdominal wall hernia 03/26/2012  . GERD (gastroesophageal reflux disease)     Past Surgical History  Procedure Laterality Date  . Cholecystectomy  2010  . Hernia repair      as a kid  . Wisdom tooth extraction    . Epigastric hernia repair N/A 04/30/2012    Procedure: HERNIA REPAIR EPIGASTRIC ADULT;  Surgeon: Clovis Pu. Cornett, MD;  Location: [facility];  Service: General;  Laterality: N/A;  . Insertion of mesh N/A 04/30/2012    Procedure: INSERTION OF MESH;  Surgeon: Clovis Pu. Cornett, MD;  Location: [facility];  Service: General;  Laterality: N/A;    Family History  Problem Relation Age of Onset  . Heart disease Mother   . Diabetes Mother     type 2  . Glaucoma Mother   . Heart disease Father 28    open heart surgery  . Hyperlipidemia Father   . Heart disease Maternal Grandmother   . Heart disease Paternal Grandmother   . Heart disease Paternal Grandfather   . Hyperlipidemia Sister    . Hyperlipidemia Sister     History   Social History  . Marital Status: Married    Spouse Name: N/A    Number of Children: N/A  . Years of Education: N/A   Occupational History  . Not on file.   Social History Main Topics  . Smoking status: Never Smoker   . Smokeless tobacco: Never Used  . Alcohol Use: Yes     Comment: socially  . Drug Use: No  . Sexual Activity: Yes   Other Topics Concern  . Not on file   Social History Narrative  . No narrative on file    Current Outpatient Prescriptions on File Prior to Visit  Medication Sig Dispense Refill  . Calcium-Magnesium-Vitamin D (CALCIUM 500 PO) Take 1 tablet by mouth daily.       . Coenzyme Q10 (CO Q 10 PO) Take 1 tablet by mouth daily.       Marland Kitchen KRILL OIL PO Take 1 tablet by mouth daily.      . Nutritional Supplements (JUICE PLUS FIBRE PO) Take 2 tablets by mouth daily.       Marland Kitchen omeprazole (PRILOSEC) 20 MG capsule Take 20 mg by mouth daily.      . vitamin B-12 (CYANOCOBALAMIN) 500 MCG tablet Take 500 mcg by mouth daily.      Marland Kitchen  vitamin C (ASCORBIC ACID) 500 MG tablet Take 500 mg by mouth daily.       No current facility-administered medications on file prior to visit.    No Known Allergies  Review of Systems  Review of Systems  Constitutional: Positive for malaise/fatigue. Negative for fever.  HENT: Negative for congestion.   Eyes: Negative for discharge.  Respiratory: Negative for shortness of breath.   Cardiovascular: Negative for chest pain, palpitations and leg swelling.  Gastrointestinal: Positive for heartburn and diarrhea. Negative for nausea, abdominal pain, constipation, blood in stool and melena.  Genitourinary: Negative for dysuria.  Musculoskeletal: Positive for joint pain. Negative for falls.       B/l heel pain  Skin: Negative for rash.  Neurological: Negative for loss of consciousness and headaches.  Endo/Heme/Allergies: Negative for polydipsia.  Psychiatric/Behavioral: Negative for depression and  suicidal ideas. The patient has insomnia. The patient is not nervous/anxious.     Objective  BP 90/70  Pulse 69  Temp(Src) 97.9 F (36.6 C) (Oral)  Ht 6\' 2"  (1.88 m)  Wt 204 lb 0.6 oz (92.552 kg)  BMI 26.19 kg/m2  SpO2 97%  Physical Exam  Physical Exam  Constitutional: He is oriented to person, place, and time and well-developed, well-nourished, and in no distress. No distress.  HENT:  Head: Normocephalic and atraumatic.  Eyes: Conjunctivae are normal.  Neck: Neck supple. No thyromegaly present.  Cardiovascular: Normal rate, regular rhythm and normal heart sounds.   No murmur heard. Pulmonary/Chest: Effort normal and breath sounds normal. No respiratory distress.  Abdominal: He exhibits no distension and no mass. There is no tenderness.  Musculoskeletal: He exhibits no edema.  Neurological: He is alert and oriented to person, place, and time.  Skin: Skin is warm.  Psychiatric: Memory, affect and judgment normal.    Lab Results  Component Value Date   TSH 1.217 03/26/2012   Lab Results  Component Value Date   WBC 7.0 10/21/2012   HGB 15.0 10/21/2012   HCT 43.8 10/21/2012   MCV 92.8 10/21/2012   PLT 233.0 10/21/2012   Lab Results  Component Value Date   CREATININE 1.0 10/21/2012   BUN 13 10/21/2012   NA 137 10/21/2012   K 4.2 10/21/2012   CL 103 10/21/2012   CO2 26 10/21/2012   Lab Results  Component Value Date   ALT 26 10/21/2012   AST 19 10/21/2012   ALKPHOS 52 10/21/2012   BILITOT 1.4* 10/21/2012   Lab Results  Component Value Date   CHOL 190 10/21/2012   Lab Results  Component Value Date   HDL 39.00* 10/21/2012   Lab Results  Component Value Date   LDLCALC 116* 10/21/2012   Lab Results  Component Value Date   TRIG 173.0* 10/21/2012   Lab Results  Component Value Date   CHOLHDL 5 10/21/2012     Assessment & Plan  Diarrhea Persistent, will refer to gastroenterology for further consideration due to his change in bowels. Encouraged probiotics  daily  Hyperlipidemia Avoid trans fats, add krill oil. Minimize simple carbs and saturated fats, increase exercise  Snoring Fitful sleep, wakes up at 3-5 am and cannot go back to sleep. To bed around 11. Encouraged sleep study but he declined. May try Melatonin 5-10 mg qhs  GERD (gastroesophageal reflux disease) Avoid offending foods, add probiotics, use Tums prn, continue Omeprazole

## 2012-11-02 NOTE — Assessment & Plan Note (Signed)
Persistent, will refer to gastroenterology for further consideration due to his change in bowels. Encouraged probiotics daily

## 2012-11-02 NOTE — Assessment & Plan Note (Signed)
Fitful sleep, wakes up at 3-5 am and cannot go back to sleep. To bed around 11. Encouraged sleep study but he declined. May try Melatonin 5-10 mg qhs

## 2012-11-02 NOTE — Assessment & Plan Note (Signed)
Avoid offending foods, add probiotics, use Tums prn, continue Omeprazole

## 2012-11-02 NOTE — Assessment & Plan Note (Signed)
Avoid trans fats, add krill oil. Minimize simple carbs and saturated fats, increase exercise

## 2012-11-03 ENCOUNTER — Encounter: Payer: Self-pay | Admitting: Internal Medicine

## 2012-11-04 ENCOUNTER — Telehealth: Payer: Self-pay

## 2012-11-04 DIAGNOSIS — R197 Diarrhea, unspecified: Secondary | ICD-10-CM

## 2012-11-04 NOTE — Telephone Encounter (Signed)
Message copied by Court Joy on Tue Nov 04, 2012  4:29 PM ------      Message from: Jack Stuart      Created: Sun Nov 02, 2012  1:47 PM       See if he is willing to come pick up Stuart kit to check Stuart Cdiff by PCR while reviewing his chart I remembered he had recently taken some antibiotics so I would like to rule this out before he sees GI ------

## 2012-11-05 NOTE — Telephone Encounter (Signed)
Patient informed and voiced understanding.  IFOB put at front desk and order placed

## 2012-12-03 ENCOUNTER — Ambulatory Visit (INDEPENDENT_AMBULATORY_CARE_PROVIDER_SITE_OTHER): Payer: 59 | Admitting: Internal Medicine

## 2012-12-03 ENCOUNTER — Other Ambulatory Visit (INDEPENDENT_AMBULATORY_CARE_PROVIDER_SITE_OTHER): Payer: 59

## 2012-12-03 ENCOUNTER — Encounter: Payer: Self-pay | Admitting: Internal Medicine

## 2012-12-03 VITALS — BP 100/80 | HR 76 | Ht 74.0 in | Wt 206.2 lb

## 2012-12-03 DIAGNOSIS — R197 Diarrhea, unspecified: Secondary | ICD-10-CM

## 2012-12-03 DIAGNOSIS — K219 Gastro-esophageal reflux disease without esophagitis: Secondary | ICD-10-CM

## 2012-12-03 LAB — IGA: IgA: 158 mg/dL (ref 68–378)

## 2012-12-03 MED ORDER — CHOLESTYRAMINE 4 G PO PACK: 1.0000 | PACK | Freq: Two times a day (BID) | ORAL | Status: DC

## 2012-12-03 NOTE — Progress Notes (Addendum)
HISTORY OF PRESENT ILLNESS:  Jack Stuart is a 47 y.o. male with hyperlipidemia and GERD who is sent by his primary care provider for evaluation of chronic diarrhea. The patient reports normal bowel habits up until the time of his cholecystectomy in 2009. Thereafter, he reports 3-4 bowel movements daily. Occasionally exacerbated by meals. Rarely at night. Variable form ranging from form stools to water. No bleeding. Imodium helps. May cause constipation. Currently using one dose of Imodium 3-4 times per week. He denies weight loss, abdominal pain, bleeding, or family history of inflammatory bowel disease. There is a family history of IBS. For GERD he has been on PPI since 2005. This controls classic reflux symptoms. No dysphagia. He has tried fiber therapy without change in symptoms. His also tried vitamin supplements with calcium, possibly helping. The problem does not interfere with his lifestyle and there is no incontinence. He did have postop epigastric hernia repair in March of this year. No prior GI evaluations. Review of outside blood work from August 2014 reveals normal CBC and comprehensive metabolic panel. Normal thyroid studies earlier this year.  REVIEW OF SYSTEMS:  All non-GI ROS negative except for sinus allergy trouble, muscle cramps, sleeping problems  Past Medical History  Diagnosis Date  . Hyperlipidemia   . Preventative health care 03/26/2012  . Abdominal wall hernia 03/26/2012  . GERD (gastroesophageal reflux disease)     Past Surgical History  Procedure Laterality Date  . Cholecystectomy  2010  . Hernia repair      as a kid  . Wisdom tooth extraction    . Epigastric hernia repair N/A 04/30/2012    Procedure: HERNIA REPAIR EPIGASTRIC ADULT;  Surgeon: Clovis Pu. Cornett, MD;  Location: [facility];  Service: General;  Laterality: N/A;  . Insertion of mesh N/A 04/30/2012    Procedure: INSERTION OF MESH;  Surgeon: Clovis Pu. Cornett, MD;  Location: [facility];  Service: General;   Laterality: N/A;    Social History Jack Stuart  reports that he has never smoked. He has never used smokeless tobacco. He reports that he drinks alcohol. He reports that he does not use illicit drugs.  family history includes Diabetes in his mother; Glaucoma in his mother; Heart disease in his maternal grandmother, mother, paternal grandfather, and paternal grandmother; Heart disease (age of onset: 80) in his father; Hyperlipidemia in his father, sister, and sister.  No Known Allergies     PHYSICAL EXAMINATION: Vital signs: BP 100/80  Pulse 76  Ht 6\' 2"  (1.88 m)  Wt 206 lb 3.2 oz (93.532 kg)  BMI 26.46 kg/m2  Constitutional: generally well-appearing, no acute distress Psychiatric: alert and oriented x3, cooperative Eyes: extraocular movements intact, anicteric, conjunctiva pink Mouth: oral pharynx moist, no lesions Neck: supple no lymphadenopathy Cardiovascular: heart regular rate and rhythm, no murmur Lungs: clear to auscultation bilaterally Abdomen: soft, nontender, nondistended, no obvious ascites, no peritoneal signs, normal bowel sounds, no organomegaly Rectal: Omitted Extremities: no lower extremity edema bilaterally Skin: no lesions on visible extremities Neuro: No focal deficits. No asterixis.    ASSESSMENT:  #1. Chronic diarrhea. The timing is most suggestive of bile salt induced diarrhea postcholecystectomy. Multiple other possible causes exist. We discussed several approaches to this problem as outlined below. Fortunately, no worrisome features or alarm features present. #2. GERD. Asymptomatic on PPI   PLAN:  #1. Tissue transglutaminase antibody and serum IgA level to screen for celiac disease #2. Initiate Questran 4 g twice a day. #3. Okay to use Imodium as needed #4.  Office followup in 6 weeks. If the problem persists we could proceed with colonoscopy with biopsies to rule out microscopic colitis. As well, to provide baseline colon cancer screening in this  soon to be 47 year old.

## 2012-12-03 NOTE — Patient Instructions (Signed)
Your physician has requested that you go to the basement for the following lab work before leaving today:  TTG, IGA  We have sent the following medications to your pharmacy for you to pick up at your convenience:  Questran  Please follow up with Dr. Marina Goodell in 6 weeks

## 2012-12-04 LAB — TISSUE TRANSGLUTAMINASE, IGA: Tissue Transglutaminase Ab, IgA: 3.8 U/mL (ref <20)

## 2013-01-14 ENCOUNTER — Ambulatory Visit: Payer: 59 | Admitting: Internal Medicine

## 2013-01-26 ENCOUNTER — Ambulatory Visit: Payer: 59 | Admitting: Internal Medicine

## 2013-02-03 ENCOUNTER — Encounter: Payer: Self-pay | Admitting: Family

## 2013-02-03 ENCOUNTER — Ambulatory Visit (HOSPITAL_BASED_OUTPATIENT_CLINIC_OR_DEPARTMENT_OTHER)
Admission: RE | Admit: 2013-02-03 | Discharge: 2013-02-03 | Disposition: A | Discharge status: Home / Self Care | Payer: 59 | Source: Ambulatory Visit | Attending: Family | Admitting: Family

## 2013-02-03 ENCOUNTER — Ambulatory Visit (INDEPENDENT_AMBULATORY_CARE_PROVIDER_SITE_OTHER): Payer: 59 | Admitting: Family

## 2013-02-03 VITALS — BP 110/80 | HR 77 | Temp 98.2°F | Resp 16 | Ht 74.0 in | Wt 206.1 lb

## 2013-02-03 DIAGNOSIS — M542 Cervicalgia: Secondary | ICD-10-CM

## 2013-02-03 MED ORDER — MELOXICAM 7.5 MG PO TABS: 7.5000 mg | ORAL_TABLET | Freq: Every day | ORAL | Status: DC

## 2013-02-03 NOTE — Progress Notes (Signed)
Pre visit review using our clinic review tool, if applicable. No additional management support is needed unless otherwise documented below in the visit note. 
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
## 2013-02-03 NOTE — Patient Instructions (Signed)
Please complete x ray on the first floor. Call if symptoms worsen or if not improved in 2-3 days.

## 2013-02-06 DIAGNOSIS — M542 Cervicalgia: Secondary | ICD-10-CM | POA: Insufficient documentation

## 2013-02-06 NOTE — Assessment & Plan Note (Signed)
X ray c-spine negative for fracture or obvious disc disease.  Advised pt likely musculoskeletal pain, trial of meloxicam, follow up if symptoms worsen or if symptoms do not improve.

## 2013-03-10 ENCOUNTER — Ambulatory Visit (INDEPENDENT_AMBULATORY_CARE_PROVIDER_SITE_OTHER): Payer: 59 | Admitting: Internal Medicine

## 2013-03-10 ENCOUNTER — Encounter: Payer: Self-pay | Admitting: Internal Medicine

## 2013-03-10 VITALS — BP 122/70 | HR 68 | Ht 73.0 in | Wt 206.0 lb

## 2013-03-10 DIAGNOSIS — R197 Diarrhea, unspecified: Secondary | ICD-10-CM

## 2013-03-10 DIAGNOSIS — K219 Gastro-esophageal reflux disease without esophagitis: Secondary | ICD-10-CM

## 2013-03-10 MED ORDER — CHOLESTYRAMINE 4 G PO PACK: 1.0000 | PACK | Freq: Two times a day (BID) | ORAL | Status: DC

## 2013-03-10 NOTE — Progress Notes (Signed)
HISTORY OF PRESENT ILLNESS:  Jack DunksDavid Stuart is a 48 y.o. male who was evaluated in 12/03/2012 regarding chronic diarrhea. See that dictation for details. For possible bile salt induced diarrhea postcholecystectomy, he was placed on Questran 4 g twice a day. He presents today for followup. He is pleased to report that he has had complete resolution of diarrhea with Questran. No side effects. No new GI issues. His testing for celiac disease was negative. He will be 48 later this year  REVIEW OF SYSTEMS:  All non-GI ROS negative except for itching  Past Medical History  Diagnosis Date  . Hyperlipidemia   . Preventative health care 03/26/2012  . Abdominal wall hernia 03/26/2012  . GERD (gastroesophageal reflux disease)     Past Surgical History  Procedure Laterality Date  . Cholecystectomy  2010  . Hernia repair      as a kid  . Wisdom tooth extraction    . Epigastric hernia repair N/A 04/30/2012    Procedure: HERNIA REPAIR EPIGASTRIC ADULT;  Surgeon: Clovis Puhomas A. Cornett, MD;  Location: [facility];  Service: General;  Laterality: N/A;  . Insertion of mesh N/A 04/30/2012    Procedure: INSERTION OF MESH;  Surgeon: Clovis Puhomas A. Cornett, MD;  Location: [facility];  Service: General;  Laterality: N/A;    Social History Jack DunksDavid Stuart  reports that he has never smoked. He has never used smokeless tobacco. He reports that he drinks alcohol. He reports that he does not use illicit drugs.  family history includes Diabetes in his mother; Glaucoma in his mother; Heart disease in his maternal grandmother, mother, paternal grandfather, and paternal grandmother; Heart disease (age of onset: 5655) in his father; Hyperlipidemia in his father, sister, and sister.  No Known Allergies     PHYSICAL EXAMINATION: Vital signs: BP 122/70  Pulse 68  Ht 6\' 1"  (1.854 m)  Wt 206 lb (93.441 kg)  BMI 27.18 kg/m2 General: Well-developed, well-nourished, no acute distress Abdomen: Not reexamined Psychiatric: alert and oriented  x3. Cooperative     ASSESSMENT:  #1. Chronic diarrhea. Resolved with Questran. Suspect bile salt induced postcholecystectomy #2. GERD. Symptoms controlled with omeprazole once daily #3. Colon cancer screening. Baseline risk.   PLAN:  #1. Continue Questran as needed to control diarrhea. Prescription with multiple refills available #2. Continue PPI and as GERD symptoms. Reflux precautions #3. Screening colonoscopy at age 89. Recall placed in computer by nurse

## 2013-03-10 NOTE — Patient Instructions (Signed)
We have sent the following medications to your pharmacy for you to pick up at your convenience: Questran 

## 2013-03-18 ENCOUNTER — Other Ambulatory Visit: Payer: Self-pay | Admitting: Internal Medicine

## 2013-03-24 ENCOUNTER — Other Ambulatory Visit: Payer: 59

## 2013-03-30 ENCOUNTER — Telehealth: Payer: Self-pay

## 2013-03-30 DIAGNOSIS — Z0000 Encounter for general adult medical examination without abnormal findings: Secondary | ICD-10-CM

## 2013-03-30 DIAGNOSIS — E785 Hyperlipidemia, unspecified: Secondary | ICD-10-CM

## 2013-03-30 DIAGNOSIS — E663 Overweight: Secondary | ICD-10-CM

## 2013-03-30 NOTE — Telephone Encounter (Signed)
Patient came into the lab today. Please advise which labs need to be done and diagnosis codes

## 2013-03-30 NOTE — Telephone Encounter (Signed)
Lab order placed.

## 2013-03-30 NOTE — Telephone Encounter (Signed)
Lipid, renal, cbc, tsh, hepatic for hyperlipid, preventative and overweight and reflux

## 2013-03-31 ENCOUNTER — Ambulatory Visit (INDEPENDENT_AMBULATORY_CARE_PROVIDER_SITE_OTHER): Payer: 59 | Admitting: Family Medicine

## 2013-03-31 ENCOUNTER — Encounter: Payer: Self-pay | Admitting: Family Medicine

## 2013-03-31 ENCOUNTER — Telehealth: Payer: Self-pay | Admitting: Family Medicine

## 2013-03-31 VITALS — BP 112/80 | HR 76 | Temp 97.7°F | Ht 74.0 in | Wt 206.1 lb

## 2013-03-31 DIAGNOSIS — Z0000 Encounter for general adult medical examination without abnormal findings: Secondary | ICD-10-CM

## 2013-03-31 DIAGNOSIS — K219 Gastro-esophageal reflux disease without esophagitis: Secondary | ICD-10-CM

## 2013-03-31 DIAGNOSIS — H109 Unspecified conjunctivitis: Secondary | ICD-10-CM

## 2013-03-31 DIAGNOSIS — E785 Hyperlipidemia, unspecified: Secondary | ICD-10-CM

## 2013-03-31 DIAGNOSIS — R197 Diarrhea, unspecified: Secondary | ICD-10-CM

## 2013-03-31 LAB — RENAL FUNCTION PANEL
Albumin: 4.4 g/dL (ref 3.5–5.2)
BUN: 12 mg/dL (ref 6–23)
CALCIUM: 9.2 mg/dL (ref 8.4–10.5)
CO2: 26 mEq/L (ref 19–32)
Chloride: 106 mEq/L (ref 96–112)
Creat: 0.89 mg/dL (ref 0.50–1.35)
Glucose, Bld: 83 mg/dL (ref 70–99)
PHOSPHORUS: 2.9 mg/dL (ref 2.3–4.6)
POTASSIUM: 4.4 meq/L (ref 3.5–5.3)
Sodium: 140 mEq/L (ref 135–145)

## 2013-03-31 LAB — HEPATIC FUNCTION PANEL
ALBUMIN: 4.4 g/dL (ref 3.5–5.2)
ALK PHOS: 61 U/L (ref 39–117)
ALT: 36 U/L (ref 0–53)
AST: 21 U/L (ref 0–37)
BILIRUBIN TOTAL: 0.9 mg/dL (ref 0.2–1.2)
Bilirubin, Direct: 0.2 mg/dL (ref 0.0–0.3)
Indirect Bilirubin: 0.7 mg/dL (ref 0.2–1.2)
Total Protein: 6.3 g/dL (ref 6.0–8.3)

## 2013-03-31 LAB — CBC
HCT: 42.7 % (ref 39.0–52.0)
HEMOGLOBIN: 15 g/dL (ref 13.0–17.0)
MCH: 31.6 pg (ref 26.0–34.0)
MCHC: 35.1 g/dL (ref 30.0–36.0)
MCV: 90.1 fL (ref 78.0–100.0)
Platelets: 231 10*3/uL (ref 150–400)
RBC: 4.74 MIL/uL (ref 4.22–5.81)
RDW: 13.8 % (ref 11.5–15.5)
WBC: 5.9 10*3/uL (ref 4.0–10.5)

## 2013-03-31 LAB — LIPID PANEL
CHOL/HDL RATIO: 4.3 ratio
CHOLESTEROL: 169 mg/dL (ref 0–200)
HDL: 39 mg/dL — ABNORMAL LOW (ref >39)
LDL Cholesterol: 104 mg/dL — ABNORMAL HIGH (ref 0–99)
Triglycerides: 131 mg/dL (ref <150)
VLDL: 26 mg/dL (ref 0–40)

## 2013-03-31 LAB — TSH: TSH: 1.878 u[IU]/mL (ref 0.350–4.500)

## 2013-03-31 MED ORDER — POLYMYXIN B-TRIMETHOPRIM 10000-0.1 UNIT/ML-% OP SOLN: 2.0000 [drp] | OPHTHALMIC | Status: DC

## 2013-03-31 NOTE — Patient Instructions (Addendum)
Digestive Advantage, Culturelle, Phillip's Colon health all good probiotics  Cholesterol Cholesterol is a white, waxy, fat-like protein needed by your body in small amounts. The liver makes all the cholesterol you need. It is carried from the liver by the blood through the blood vessels. Deposits (plaque) may build up on blood vessel walls. This makes the arteries narrower and stiffer. Plaque increases the risk for heart attack and stroke. You cannot feel your cholesterol level even if it is very high. The only way to know is by a blood test to check your lipid (fats) levels. Once you know your cholesterol levels, you should keep a record of the test results. Work with your caregiver to to keep your levels in the desired range. WHAT THE RESULTS MEAN:  Total cholesterol is a rough measure of all the cholesterol in your blood.  LDL is the so-called bad cholesterol. This is the type that deposits cholesterol in the walls of the arteries. You want this level to be low.  HDL is the good cholesterol because it cleans the arteries and carries the LDL away. You want this level to be high.  Triglycerides are fat that the body can either burn for energy or store. High levels are closely linked to heart disease. DESIRED LEVELS:  Total cholesterol below 200.  LDL below 100 for people at risk, below 70 for very high risk.  HDL above 50 is good, above 60 is best.  Triglycerides below 150. HOW TO LOWER YOUR CHOLESTEROL:  Diet.  Choose fish or white meat chicken and [place]key, roasted or baked. Limit fatty cuts of red meat, fried foods, and processed meats, such as sausage and lunch meat.  Eat lots of fresh fruits and vegetables. Choose whole grains, beans, pasta, potatoes and cereals.  Use only small amounts of olive, corn or canola oils. Avoid butter, mayonnaise, shortening or palm kernel oils. Avoid foods with trans-fats.  Use skim/nonfat milk and low-fat/nonfat yogurt and cheeses. Avoid whole milk,  cream, ice cream, egg yolks and cheeses. Healthy desserts include angel food cake, ginger snaps, animal crackers, hard candy, popsicles, and low-fat/nonfat frozen yogurt. Avoid pastries, cakes, pies and cookies.  Exercise.  A regular program helps decrease LDL and raises HDL.  Helps with weight control.  Do things that increase your activity level like gardening, walking, or taking the stairs.  Medication.  May be prescribed by your caregiver to help lowering cholesterol and the risk for heart disease.  You may need medicine even if your levels are normal if you have several risk factors. HOME CARE INSTRUCTIONS   Follow your diet and exercise programs as suggested by your caregiver.  Take medications as directed.  Have blood work done when your caregiver feels it is necessary. MAKE SURE YOU:   Understand these instructions.  Will watch your condition.  Will get help right away if you are not doing well or get worse. Document Released: 11/07/2000 Document Revised: 05/07/2011 Document Reviewed: 11/26/2012 [facility] Patient Information 2014 [employer], [place].

## 2013-03-31 NOTE — Progress Notes (Signed)
Pre visit review using our clinic review tool, if applicable. No additional management support is needed unless otherwise documented below in the visit note. 

## 2013-03-31 NOTE — Telephone Encounter (Signed)
Lab order week of 03-30-2014 Annual with labs prior lipid, renal, cbc, tsh, hepatic, psa

## 2013-04-01 ENCOUNTER — Encounter: Payer: Self-pay | Admitting: Family Medicine

## 2013-04-01 DIAGNOSIS — H109 Unspecified conjunctivitis: Secondary | ICD-10-CM

## 2013-04-01 HISTORY — DX: Unspecified conjunctivitis: H10.9

## 2013-04-01 NOTE — Assessment & Plan Note (Signed)
Greatly improved with Questran is encouraged to restart probiotics and titrate down on Questran as tolerated

## 2013-04-01 NOTE — Assessment & Plan Note (Signed)
Seen by GI doing well with Omeprazole

## 2013-04-01 NOTE — Assessment & Plan Note (Signed)
Encouraged heart healthy diet, regular exercise and adequate sleep 

## 2013-04-01 NOTE — Telephone Encounter (Signed)
Lab order placed.

## 2013-04-01 NOTE — Progress Notes (Signed)
Patient ID: Jack Stuart, male   DOB: 13-Nov-1965, 48 y.o.   MRN: [id number] Jack Stuart [id number] 1965-12-11 04/01/2013      Progress Note-Follow Up  Subjective  Chief Complaint  Chief Complaint  Patient presents with  . Annual Exam    physical    HPI  Patient is a 48 year old Caucasian male who is in today for annual exam. He is feeling much better. He has been using Questran and his diarrhea is improved. He's not having any abdominal pain. He's been seen by GI and will proceed with colonoscopy later this year. Reflux is improved with omeprazole. No recent illness. No chest pain, palpitations or shortness of breath. He has had some trouble with irritation in his eyes over the last one to 2 weeks. It started in his right eye which is now improved but his left eye as got some discharge and some mild irritation. No photophobia or significant pain is noted. No fevers or chills.  Past Medical History  Diagnosis Date  . Hyperlipidemia   . Preventative health care 03/26/2012  . Abdominal wall hernia 03/26/2012  . GERD (gastroesophageal reflux disease)     Past Surgical History  Procedure Laterality Date  . Cholecystectomy  2010  . Hernia repair      as a kid  . Wisdom tooth extraction    . Epigastric hernia repair N/A 04/30/2012    Procedure: HERNIA REPAIR EPIGASTRIC ADULT;  Surgeon: Clovis Pu. Cornett, MD;  Location: [facility];  Service: General;  Laterality: N/A;  . Insertion of mesh N/A 04/30/2012    Procedure: INSERTION OF MESH;  Surgeon: Clovis Pu. Cornett, MD;  Location: [facility];  Service: General;  Laterality: N/A;    Family History  Problem Relation Age of Onset  . Heart disease Mother   . Diabetes Mother     type 2  . Glaucoma Mother   . Heart disease Father 48    open heart surgery  . Hyperlipidemia Father   . Heart disease Maternal Grandmother   . Heart disease Paternal Grandmother   . Heart disease Paternal Grandfather   . Hyperlipidemia Sister   . Hyperlipidemia  Sister     History   Social History  . Marital Status: Married    Spouse Name: N/A    Number of Children: 2  . Years of Education: N/A   Occupational History  . [employer]    Social History Main Topics  . Smoking status: Never Smoker   . Smokeless tobacco: Never Used  . Alcohol Use: Yes     Comment: socially  . Drug Use: No  . Sexual Activity: Yes   Other Topics Concern  . Not on file   Social History Narrative  . No narrative on file    Current Outpatient Prescriptions on File Prior to Visit  Medication Sig Dispense Refill  . Calcium-Magnesium-Vitamin D (CALCIUM 500 PO) Take 1 tablet by mouth daily.       . cholestyramine (QUESTRAN) 4 G packet Take 1 packet by mouth 2 (two) times daily.  60 each  11  . Coenzyme Q10 (CO Q 10 PO) Take 1 tablet by mouth daily.       Marland Kitchen KRILL OIL PO Take 1 tablet by mouth daily.      . meloxicam (MOBIC) 7.5 MG tablet Take 1 tablet (7.5 mg total) by mouth daily.  14 tablet  0  . Multiple Vitamin (MULTIVITAMIN) tablet Take 1 tablet by mouth daily.      Marland Kitchen  Nutritional Supplements (JUICE PLUS FIBRE PO) Take 2 tablets by mouth daily.       Marland Kitchen. omeprazole (PRILOSEC) 20 MG capsule Take 20 mg by mouth daily.      . vitamin B-12 (CYANOCOBALAMIN) 500 MCG tablet Take 500 mcg by mouth daily.      . vitamin C (ASCORBIC ACID) 500 MG tablet Take 500 mg by mouth daily.       No current facility-administered medications on file prior to visit.    No Known Allergies  Review of Systems  Review of Systems  Constitutional: Negative for fever and malaise/fatigue.  HENT: Negative for congestion.   Eyes: Negative for discharge.  Respiratory: Negative for shortness of breath.   Cardiovascular: Negative for chest pain, palpitations and leg swelling.  Gastrointestinal: Negative for nausea, abdominal pain and diarrhea.  Genitourinary: Negative for dysuria.  Musculoskeletal: Negative for falls.  Skin: Negative for rash.  Neurological: Negative for loss of  consciousness and headaches.  Endo/Heme/Allergies: Negative for polydipsia.  Psychiatric/Behavioral: Negative for depression and suicidal ideas. The patient is not nervous/anxious and does not have insomnia.     Objective  BP 112/80  Pulse 76  Temp(Src) 97.7 F (36.5 C) (Oral)  Ht 6\' 2"  (1.88 m)  Wt 206 lb 1.9 oz (93.495 kg)  BMI 26.45 kg/m2  SpO2 96%  Physical Exam  Physical Exam  Constitutional: He is oriented to person, place, and time and well-developed, well-nourished, and in no distress. No distress.  HENT:  Head: Normocephalic and atraumatic.  Eyes: Conjunctivae are normal.  Neck: Neck supple. No thyromegaly present.  Cardiovascular: Normal rate, regular rhythm and normal heart sounds.   No murmur heard. Pulmonary/Chest: Effort normal and breath sounds normal. No respiratory distress.  Abdominal: He exhibits no distension and no mass. There is no tenderness.  Musculoskeletal: He exhibits no edema.  Neurological: He is alert and oriented to person, place, and time.  Skin: Skin is warm.  Psychiatric: Memory, affect and judgment normal.    Lab Results  Component Value Date   TSH 1.878 03/31/2013   Lab Results  Component Value Date   WBC 5.9 03/31/2013   HGB 15.0 03/31/2013   HCT 42.7 03/31/2013   MCV 90.1 03/31/2013   PLT 231 03/31/2013   Lab Results  Component Value Date   CREATININE 0.89 03/31/2013   BUN 12 03/31/2013   NA 140 03/31/2013   K 4.4 03/31/2013   CL 106 03/31/2013   CO2 26 03/31/2013   Lab Results  Component Value Date   ALT 36 03/31/2013   AST 21 03/31/2013   ALKPHOS 61 03/31/2013   BILITOT 0.9 03/31/2013   Lab Results  Component Value Date   CHOL 169 03/31/2013   Lab Results  Component Value Date   HDL 39* 03/31/2013   Lab Results  Component Value Date   LDLCALC 104* 03/31/2013   Lab Results  Component Value Date   TRIG 131 03/31/2013   Lab Results  Component Value Date   CHOLHDL 4.3 03/31/2013     Assessment & Plan  Hyperlipidemia Improved, avoid  trans fats, maintain heart healthy diet. increase exercise.  Diarrhea Greatly improved with Questran is encouraged to restart probiotics and titrate down on Questran as tolerated  GERD (gastroesophageal reflux disease) Seen by GI doing well with Omeprazole  Preventative health care Encouraged heart healthy diet, regular exercise and adequate sleep  Conjunctivitis Moist compresses 4 x daily if no improvement is given eye drops to use prn

## 2013-04-01 NOTE — Assessment & Plan Note (Signed)
Improved, avoid trans fats, maintain heart healthy diet. increase exercise.

## 2013-04-01 NOTE — Assessment & Plan Note (Signed)
Moist compresses 4 x daily if no improvement is given eye drops to use prn

## 2013-04-30 ENCOUNTER — Telehealth: Payer: Self-pay

## 2013-04-30 NOTE — Telephone Encounter (Signed)
FYI:  Marj brought back an IFOB kit that pt never picked up

## 2013-05-15 ENCOUNTER — Telehealth: Payer: Self-pay | Admitting: Family Medicine

## 2013-05-15 NOTE — Telephone Encounter (Signed)
Stomach pains does not classically fit the flu and the med for the flu does not help any other virus. If he is that sick he really needs his vitals checked and an exam likely with lab work to assess how sick he is. There is not just one med that will fix everything no matter what it is. For a viral illness the treatment is hydrate extra, 24 hours of clear fluids, then advance to [employer] diet (bananas, rice, applesauce, toast) use tylenol and advil to control pain and fever, add a probiotic and get looked at if gets worse, has severe pain, fevers  Worsening, mental status changes occur etc.

## 2013-05-15 NOTE — Telephone Encounter (Signed)
Wife states pt has the flu, can something be called in? Please advise. Does not want to bring him in, he is very sick.... Chills, temp 102-103, headache, cough, stomach pains

## 2013-05-15 NOTE — Telephone Encounter (Signed)
Left a message for spouse that pt will need to call us back to be given the information due to it stating we can't discuss anything with anyone. Stated that I was sorry for the inconvenience

## 2013-05-20 NOTE — Telephone Encounter (Signed)
Spoke with pt. He states he saw urgent care and was diagnosed with the flu. He is feeling a little better. Nothing further needed at this time.

## 2013-06-10 ENCOUNTER — Encounter: Payer: Self-pay | Admitting: Family Medicine

## 2013-06-10 ENCOUNTER — Ambulatory Visit (INDEPENDENT_AMBULATORY_CARE_PROVIDER_SITE_OTHER): Payer: 59 | Admitting: Family Medicine

## 2013-06-10 VITALS — BP 108/73 | HR 76 | Temp 98.1°F | Resp 18 | Ht 74.0 in | Wt 202.0 lb

## 2013-06-10 DIAGNOSIS — M25562 Pain in left knee: Secondary | ICD-10-CM

## 2013-06-10 DIAGNOSIS — M25569 Pain in unspecified knee: Secondary | ICD-10-CM

## 2013-06-10 MED ORDER — PREDNISONE 20 MG PO TABS: ORAL_TABLET | ORAL | Status: DC

## 2013-06-10 NOTE — Progress Notes (Signed)
OFFICE NOTE  06/10/2013  CC:  Chief Complaint  Patient presents with  . Cough    x 3-4 weeks     HPI: Patient is a 48 y.o. Caucasian male who is here for cough. Dry cough, gets worse in afternoon and evening, going on several weeks ever since a ILI. Stuffy nose some.  HA only occasionally.  No fever.  NO chest tightness, wheezing, or SOB. Tessalon perles.  Mucinex.  Delsym.  Feels minimal GERD sx's.  Not a smoker. No loss of voice but has a feeling of a "lump" in his throat that doesn't move.  Pertinent PMH:  Past medical, surgical hx reviewed.  MEDS:  Outpatient Prescriptions Prior to Visit  Medication Sig Dispense Refill  . cholestyramine (QUESTRAN) 4 G packet Take 1 packet by mouth 2 (two) times daily.  60 each  11  . Coenzyme Q10 (CO Q 10 PO) Take 1 tablet by mouth daily.       Marland Kitchen. KRILL OIL PO Take 1 tablet by mouth daily.      . Multiple Vitamin (MULTIVITAMIN) tablet Take 1 tablet by mouth daily.      Marland Kitchen. omeprazole (PRILOSEC) 20 MG capsule Take 20 mg by mouth daily.      . vitamin B-12 (CYANOCOBALAMIN) 500 MCG tablet Take 500 mcg by mouth daily.      . vitamin C (ASCORBIC ACID) 500 MG tablet Take 500 mg by mouth daily.      . Calcium-Magnesium-Vitamin D (CALCIUM 500 PO) Take 1 tablet by mouth daily.       . meloxicam (MOBIC) 7.5 MG tablet Take 1 tablet (7.5 mg total) by mouth daily.  14 tablet  0  . Nutritional Supplements (JUICE PLUS FIBRE PO) Take 2 tablets by mouth daily.       Marland Kitchen. trimethoprim-polymyxin b (POLYTRIM) ophthalmic solution Place 2 drops into both eyes every 4 (four) hours.  10 mL  0   No facility-administered medications prior to visit.    PE: Blood pressure 108/73, pulse 76, temperature 98.1 F (36.7 C), temperature source Temporal, resp. rate 18, height 6\' 2"  (1.88 m), weight 202 lb (91.627 kg), SpO2 98.00%. Gen: Alert, well appearing.  Patient is oriented to person, place, time, and situation. ENT: Ears: EACs clear, normal epithelium.  TMs with good  light reflex and landmarks bilaterally.  Eyes: no injection, icteris, swelling, or exudate.  EOMI, PERRLA. Nose: some crusting along mucosa but no active drainage or turbinate edema/swelling.  No injection or focal lesion.  Mouth: lips without lesion/swelling.  Oral mucosa pink and moist.  Dentition intact and without obvious caries or gingival swelling.  Oropharynx without erythema, exudate, or swelling.  Neck - No masses or thyromegaly or limitation in range of motion CV: RRR, no m/r/g.   LUNGS: CTA bilat, nonlabored resps, good aeration in all lung fields. Mild post-exhalation coughing after forced exhalation maneuver today.   IMPRESSION AND PLAN:  1) Postviral pneumonitis, with some upper airway cough syndrome component. Discussed treatment options. Decided to do prednisone taper 40 qd x 4, 20 qd x 4d, 10 qd x 4d. Also increase omeprazole to 40mg  qd for the next 2 weeks. Avoid OTC cough/cold meds.  2) Prolonged left knee pain: at end of visit today he told me of about 3 months of left knee pain after he "tweeked" it going down stairs one day. We did not have sufficient time to address this today and patient was happy to be referred to sports medicine for further  evaluation of this pain.  An After Visit Summary was printed and given to the patient.  FOLLOW UP: prn

## 2013-06-10 NOTE — Progress Notes (Signed)
Pre visit review using our clinic review tool, if applicable. No additional management support is needed unless otherwise documented below in the visit note. 

## 2013-06-29 ENCOUNTER — Ambulatory Visit: Payer: 59 | Admitting: Family Medicine

## 2013-06-29 DIAGNOSIS — Z0289 Encounter for other administrative examinations: Secondary | ICD-10-CM

## 2013-07-07 ENCOUNTER — Other Ambulatory Visit (INDEPENDENT_AMBULATORY_CARE_PROVIDER_SITE_OTHER): Payer: 59

## 2013-07-07 ENCOUNTER — Ambulatory Visit (INDEPENDENT_AMBULATORY_CARE_PROVIDER_SITE_OTHER): Payer: 59 | Admitting: Family Medicine

## 2013-07-07 ENCOUNTER — Encounter: Payer: Self-pay | Admitting: Family Medicine

## 2013-07-07 VITALS — BP 122/84 | HR 74

## 2013-07-07 DIAGNOSIS — M25569 Pain in unspecified knee: Secondary | ICD-10-CM

## 2013-07-07 DIAGNOSIS — M25562 Pain in left knee: Secondary | ICD-10-CM

## 2013-07-07 DIAGNOSIS — M238X9 Other internal derangements of unspecified knee: Secondary | ICD-10-CM

## 2013-07-07 DIAGNOSIS — M2350 Chronic instability of knee, unspecified knee: Secondary | ICD-10-CM

## 2013-07-07 NOTE — Progress Notes (Signed)
  Jack ScaleZach Demir Titsworth D.O. [facility] [address] [place], [place] [address] Phone: [phone]) [phone] Subjective:    I'm seeing this patient by the request  of:  Danise EdgeBLYTH, STACEY, MD, McGowen MD.    CC: Knee pain, Left  BJY:[id number]:Subjective Benna DunksDavid Clermont is a 48 y.o. male coming in with complaint of  Left knee pain. Patient states that there may have been an injury when he did tweak it when going down some stairs. Patient states that this occurred in December. 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 Patient denies any swelling but states that from time to time it seems to catch him. Over the course last 2 weeks ago it has seem to be improving slowly. Patient is an avid runner running 10-15 miles a week. Patient states that he is able to run without any significant discomfort. Patient states that the pain is 4/10 in severity.    Past medical history, social, surgical and family history all reviewed in electronic medical record.   Review of Systems: No headache, visual changes, nausea, vomiting, diarrhea, constipation, dizziness, abdominal pain, skin rash, fevers, chills, night sweats, weight loss, swollen lymph nodes, body aches, joint swelling, muscle aches, chest pain, shortness of breath, mood changes.   Objective Blood pressure 122/84, pulse 74, SpO2 98.00%.  General: No apparent distress alert and oriented x3 mood and affect normal, dressed appropriately.  HEENT: Pupils equal, extraocular movements intact  Respiratory: Patient's speak in full sentences and does not appear short of breath  Cardiovascular: No lower extremity edema, non tender, no erythema  Skin: Warm dry intact with no signs of infection or rash on extremities or on axial skeleton.  Abdomen: Soft nontender  Neuro: Cranial nerves II through XII are intact, neurovascularly intact in all extremities with 2+ DTRs and 2+ pulses.    Lymph: No lymphadenopathy of posterior or anterior cervical chain or axillae bilaterally.  Gait normal with good balance and coordination.  MSK:  Non tender with full range of motion and good stability and symmetric strength and tone of shoulders, elbows, wrist, hip, nd ankles bilaterally.  Knee: Left Normal to inspection with no erythema or effusion or obvious bony abnormalities. Palpation normal with no warmth, joint line tenderness, patellar tenderness, or condyle tenderness. ROM full in flexion and extension and lower leg rotation. Ligaments with solid consistent endpoints including ACL, PCL, LCL, MCL. Mild discomfort with Mcmurray's, Apley's, and Thessalonian tests. Non painful patellar compression. Patellar glide without crepitus. Patellar and quadriceps tendons unremarkable. Hamstring and quadriceps strength is normal.  Contralateral knee unremarkable  MSK US performed of: Left knee pain This study was ordered, performed, and interpreted by Terrilee FilesZach Shaul Trautman D.O.  Knee: All structures visualized. Anteromedial, , posteromedial, and posterolateral menisci unremarkable without tearing, fraying, effusion, or displacement. Anterior lateral meniscus shows the patient did have a very large tear now has some calcific changes. Patellar Tendon unremarkable on long and transverse views without effusion. No abnormality of prepatellar bursa. LCL and MCL unremarkable on long and transverse views. No abnormality of origin of medial or lateral head of the gastrocnemius.  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 Calcific changes noted      Impression and Recommendations:     This case required medical decision making of moderate complexity.

## 2013-07-07 NOTE — Assessment & Plan Note (Signed)
Patient does have a chronic meniscal tear that seems to be actually attempting to heal on its own with calcific changes. Patient given a brace was fitted by me today. We discussed icing protocol as well as over-the-counter medications. We discuss crosstraining to avoid any impact and told him to avoid any twisting or squatting motions for now. Patient will come back again in 4 weeks for further evaluation. She is continuing to have pain we can consider an intra-articular injection. May also have to consider further imaging.

## 2013-07-07 NOTE — Patient Instructions (Signed)
Nice to meet you Ice 20 minutes at least daily especially after activity Wear brace with activity. Limit running 3 times a week. Cross train with swimming or biking rest of the week.  Ibuprofen 600mg  three times a day for 3 days.  Exercises 3 times a week.  Come back again in 4 weeks to make sure you are doing better.

## 2013-08-04 ENCOUNTER — Encounter: Payer: Self-pay | Admitting: Family Medicine

## 2013-08-04 ENCOUNTER — Ambulatory Visit (INDEPENDENT_AMBULATORY_CARE_PROVIDER_SITE_OTHER): Payer: 59 | Admitting: Family Medicine

## 2013-08-04 VITALS — BP 112/70 | HR 80 | Ht 74.0 in | Wt 202.0 lb

## 2013-08-04 DIAGNOSIS — M238X9 Other internal derangements of unspecified knee: Secondary | ICD-10-CM

## 2013-08-04 DIAGNOSIS — M2350 Chronic instability of knee, unspecified knee: Secondary | ICD-10-CM

## 2013-08-04 NOTE — Patient Instructions (Signed)
Good to see you Squats to 75 degrees  For another 3 week then full 90. Leg press to 75 degrees Leg extension s all day.  Biking is good Ice after activity Start a walk-run progression: - I would like you to do line drills (try to keep foot on a line when jogging). - Initially start one minute walking than one minute running for 20 mins in the first week,   then 25 mins during the second week, then 30 mins afterwards.  Once you have reached 30 mins: - Run 2 mins, then walk 1 min. -Then run 3 mins, and walk 1 min. -Then run 4 mins, and walk 1 min. -Then run 5 mins, and walk 1 min. -Slowly build up weekly to running 30 mins nonstop.  If painful at any of the steps, back up one step.  See me again in 4 weeks.

## 2013-08-04 NOTE — Progress Notes (Signed)
  [facility] [address] [place], [name] [address] Phone: [phone] Subjective:      CC: Knee pain, Left follow up  IDP:[id number] Jack Stuart is a 48 y.o. male coming in for follow up of left knee pain. Patient was seen previously and was diagnosed with lateral meniscal tear that appeared to be chronic. Patient was given braces, home exercises, and icing protocol. Patient states he is 80-90% better. Patient still notices some mild discomfort when he does a twisting sensation. Denies any radiation of pain to the legs or any weakness. Patient would like to get back to regular activity. Patient did use to run on a regular basis.  Past medical history, social, surgical and family history all reviewed in electronic medical record.   Review of Systems: No headache, visual changes, nausea, vomiting, diarrhea, constipation, dizziness, abdominal pain, skin rash, fevers, chills, night sweats, weight loss, swollen lymph nodes, body aches, joint swelling, muscle aches, chest pain, shortness of breath, mood changes.   Objective Blood pressure 112/70, pulse 80, height 6\' 2"  (1.88 m), weight 202 lb (91.627 kg), SpO2 97.00%.  General: No apparent distress alert and oriented x3 mood and affect normal, dressed appropriately.  HEENT: Pupils equal, extraocular movements intact  Respiratory: Patient's speak in full sentences and does not appear short of breath  Cardiovascular: No lower extremity edema, non tender, no erythema  Skin: Warm dry intact with no signs of infection or rash on extremities or on axial skeleton.  Abdomen: Soft nontender  Neuro: Cranial nerves II through XII are intact, neurovascularly intact in all extremities with 2+ DTRs and 2+ pulses.  Lymph: No lymphadenopathy of posterior or anterior cervical chain or axillae bilaterally.  Gait normal with good balance and coordination.  MSK:  Non tender with full range of motion and good stability and  symmetric strength and tone of shoulders, elbows, wrist, hip, nd ankles bilaterally.  Knee: Left Normal to inspection with no erythema or effusion or obvious bony abnormalities. Palpation normal with no warmth, joint line tenderness, patellar tenderness, or condyle tenderness. ROM full in flexion and extension and lower leg rotation. Ligaments with solid consistent endpoints including ACL, PCL, LCL, MCL. Negative Mcmurray's, Apley's, and Thessalonian tests. Non painful patellar compression. Patellar glide without crepitus. Patellar and quadriceps tendons unremarkable. Hamstring and quadriceps strength is normal.  Contralateral knee unremarkable  MSK US performed of: Left knee pain This study was ordered, performed, and interpreted by Terrilee Files D.O.  Knee: All structures visualized. Anteromedial, , posteromedial, and posterolateral menisci unremarkable without tearing, fraying, effusion, or displacement. Anterior lateral meniscus shows significant healing very mild chronic tear still appreciated. Patient's calcium deposits has significantly decreased as well. Patellar Tendon unremarkable on long and transverse views without effusion. No abnormality of prepatellar bursa. LCL and MCL unremarkable on long and transverse views. No abnormality of origin of medial or lateral head of the gastrocnemius.  IMPRESSION:  Healing lateral meniscal tear.      Impression and Recommendations:     This case required medical decision making of moderate complexity.

## 2013-08-04 NOTE — Assessment & Plan Note (Signed)
Patient is doing significantly better at this time. Encourage him to continue the exercises 3 times a week as well as continue bracing when needed. Patient can't increase his activity including running and was given a progression. We discussed over-the-counter medications and to be beneficial. Patient would like to report to sports more regular basis. Patient will come back again in 4 weeks for further evaluation and treatment.

## 2013-09-02 ENCOUNTER — Encounter: Payer: Self-pay | Admitting: Family Medicine

## 2013-09-02 ENCOUNTER — Ambulatory Visit (INDEPENDENT_AMBULATORY_CARE_PROVIDER_SITE_OTHER): Payer: 59 | Admitting: Family Medicine

## 2013-09-02 VITALS — BP 116/82 | HR 82 | Ht 74.0 in | Wt 203.0 lb

## 2013-09-02 DIAGNOSIS — M2352 Chronic instability of knee, left knee: Secondary | ICD-10-CM

## 2013-09-02 DIAGNOSIS — M238X9 Other internal derangements of unspecified knee: Secondary | ICD-10-CM

## 2013-09-02 NOTE — Assessment & Plan Note (Signed)
Chest unremarkable well at this time. Patient did have the goal of returning to running. Patient was given a running progression today that I think will be beneficial. We discussed icing protocol after the activity. We discussed proper shoe wear that would possibly be helpful as well. Patient does have a past medical history significant for plantar fasciitis of probably there is some gait disturbance that could be addressed in patient in followup if necessary. Regarding his knee though as long as he continues to do well he'll followup on an as-needed basis.

## 2013-09-02 NOTE — Progress Notes (Signed)
  Tawana ScaleZach Alonnie Bieker D.O. [facility] [address] [place], [place] [address] Phone: [phone] Subjective:      CC: Knee pain, Left follow up  BJY:NWGNFAOZHYHPI:Subjective Jack DunksDavid Stuart is a 48 y.o. male coming in for follow up of left knee pain. Patient was seen previously and was diagnosed with lateral meniscal tear that appeared to be chronic. Patient was given braces, home exercises, and icing protocol. Patient states he is actually having no pain. Overall he is doing relatively well. Patient states that he has not started running or doing the exercises as regularly. Patient is no longer wearing the brace. Patient has not taken any medications. Patient has no pains with daily activity and can sleep comfortably. Patient is happy with the results.  Past medical history, social, surgical and family history all reviewed in electronic medical record.   Review of Systems: No headache, visual changes, nausea, vomiting, diarrhea, constipation, dizziness, abdominal pain, skin rash, fevers, chills, night sweats, weight loss, swollen lymph nodes, body aches, joint swelling, muscle aches, chest pain, shortness of breath, mood changes.   Objective Blood pressure 116/82, pulse 82, height 6\' 2"  (1.88 m), weight 203 lb (92.08 kg), SpO2 96.00%.  General: No apparent distress alert and oriented x3 mood and affect normal, dressed appropriately.  HEENT: Pupils equal, extraocular movements intact  Respiratory: Patient's speak in full sentences and does not appear short of breath  Cardiovascular: No lower extremity edema, non tender, no erythema  Skin: Warm dry intact with no signs of infection or rash on extremities or on axial skeleton.  Abdomen: Soft nontender  Neuro: Cranial nerves II through XII are intact, neurovascularly intact in all extremities with 2+ DTRs and 2+ pulses.  Lymph: No lymphadenopathy of posterior or anterior cervical chain or axillae bilaterally.  Gait normal with good balance and  coordination.  MSK:  Non tender with full range of motion and good stability and symmetric strength and tone of shoulders, elbows, wrist, hip, nd ankles bilaterally.  Knee: Left Normal to inspection with no erythema or effusion or obvious bony abnormalities. Palpation normal with no warmth, joint line tenderness, patellar tenderness, or condyle tenderness. ROM full in flexion and extension and lower leg rotation. Ligaments with solid consistent endpoints including ACL, PCL, LCL, MCL. Negative Mcmurray's, Apley's, and Thessalonian tests. Non painful patellar compression. Patellar glide without crepitus. Patellar and quadriceps tendons unremarkable. Hamstring and quadriceps strength is normal.  Contralateral knee unremarkable  MSK US performed of: Left knee This study was ordered, performed, and interpreted by Terrilee FilesZach Mckaylah Bettendorf D.O.  Knee: All structures visualized. Anteromedial, anterolateral, posteromedial, and posterolateral menisci unremarkable without tearing, fraying, effusion, or displacement. Previous imaging of meniscal tear completely resolved. Patellar Tendon unremarkable on long and transverse views without effusion. No abnormality of prepatellar bursa. LCL and MCL unremarkable on long and transverse views. No abnormality of origin of medial or lateral head of the gastrocnemius.  IMPRESSION:  Healed meniscal tear       Impression and Recommendations:     This case required medical decision making of moderate complexity.

## 2013-09-02 NOTE — Patient Instructions (Signed)
Good to see you For your foot.   Try exercises 3 times a week.  On stair drop heels as far as you can go up on toes, hold 2 seconds down slow for count of 4 seconds, 1 set of 30 reps daily for 1 week. Then 2 sets daily for 1 week then 3 sets daily thereafter.  Ice bath 20 minutes after running or before bed.   Start a walk-run progression: - I would like you to do line drills (try to keep foot on a line when jogging). - Initially start one minute walking than one minute running for 20 mins in the first week,   then 25 mins during the second week, then 30 mins afterwards.  Once you have reached 30 mins: - Run 2 mins, then walk 1 min. -Then run 3 mins, and walk 1 min. -Then run 4 mins, and walk 1 min. -Then run 5 mins, and walk 1 min. -Slowly build up weekly to running 30 mins nonstop.  If painful at any of the steps, back up one step.  Come back when you need me.

## 2013-10-16 ENCOUNTER — Encounter: Payer: Self-pay | Admitting: Internal Medicine

## 2014-01-29 ENCOUNTER — Telehealth: Payer: Self-pay

## 2014-01-29 MED ORDER — CHOLESTYRAMINE 4 G PO PACK: 1.0000 | PACK | Freq: Two times a day (BID) | ORAL | Status: DC

## 2014-01-29 NOTE — Telephone Encounter (Signed)
Refilled Questran in 90 day supply

## 2014-04-06 ENCOUNTER — Ambulatory Visit (INDEPENDENT_AMBULATORY_CARE_PROVIDER_SITE_OTHER): Payer: 59 | Admitting: Family Medicine

## 2014-04-06 ENCOUNTER — Encounter: Payer: Self-pay | Admitting: Family Medicine

## 2014-04-06 VITALS — BP 108/72 | HR 70 | Temp 97.8°F | Ht 74.0 in | Wt 204.0 lb

## 2014-04-06 DIAGNOSIS — Z1211 Encounter for screening for malignant neoplasm of colon: Secondary | ICD-10-CM

## 2014-04-06 DIAGNOSIS — M2352 Chronic instability of knee, left knee: Secondary | ICD-10-CM

## 2014-04-06 DIAGNOSIS — E782 Mixed hyperlipidemia: Secondary | ICD-10-CM

## 2014-04-06 DIAGNOSIS — E785 Hyperlipidemia, unspecified: Secondary | ICD-10-CM

## 2014-04-06 DIAGNOSIS — R0789 Other chest pain: Secondary | ICD-10-CM

## 2014-04-06 DIAGNOSIS — K219 Gastro-esophageal reflux disease without esophagitis: Secondary | ICD-10-CM

## 2014-04-06 DIAGNOSIS — Z0000 Encounter for general adult medical examination without abnormal findings: Secondary | ICD-10-CM

## 2014-04-06 NOTE — Progress Notes (Signed)
Pre visit review using our clinic review tool, if applicable. No additional management support is needed unless otherwise documented below in the visit note. 

## 2014-04-06 NOTE — Patient Instructions (Signed)
Preventive Care for Adults A healthy lifestyle and preventive care can promote health and wellness. Preventive health guidelines for men include the following key practices:  A routine yearly physical is a good way to check with your health care provider about your health and preventative screening. It is a chance to share any concerns and updates on your health and to receive a thorough exam.  Visit your dentist for a routine exam and preventative care every 6 months. Brush your teeth twice a day and floss once a day. Good oral hygiene prevents tooth decay and gum disease.  The frequency of eye exams is based on your age, health, family medical history, use of contact lenses, and other factors. Follow your health care provider's recommendations for frequency of eye exams.  Eat a healthy diet. Foods such as vegetables, fruits, whole grains, low-fat dairy products, and lean protein foods contain the nutrients you need without too many calories. Decrease your intake of foods high in solid fats, added sugars, and salt. Eat the right amount of calories for you.Get information about a proper diet from your health care provider, if necessary.  Regular physical exercise is one of the most important things you can do for your health. Most adults should get at least 150 minutes of moderate-intensity exercise (any activity that increases your heart rate and causes you to sweat) each week. In addition, most adults need muscle-strengthening exercises on 2 or more days a week.  Maintain a healthy weight. The body mass index (BMI) is a screening tool to identify possible weight problems. It provides an estimate of body fat based on height and weight. Your health care provider can find your BMI and can help you achieve or maintain a healthy weight.For adults 20 years and older:  A BMI below 18.5 is considered underweight.  A BMI of 18.5 to 24.9 is normal.  A BMI of 25 to 29.9 is considered overweight.  A BMI  of 30 and above is considered obese.  Maintain normal blood lipids and cholesterol levels by exercising and minimizing your intake of saturated fat. Eat a balanced diet with plenty of fruit and vegetables. Blood tests for lipids and cholesterol should begin at age 50 and be repeated every 5 years. If your lipid or cholesterol levels are high, you are over 50, or you are at high risk for heart disease, you may need your cholesterol levels checked more frequently.Ongoing high lipid and cholesterol levels should be treated with medicines if diet and exercise are not working.  If you smoke, find out from your health care provider how to quit. If you do not use tobacco, do not start.  Lung cancer screening is recommended for adults aged 73-80 years who are at high risk for developing lung cancer because of a history of smoking. A yearly low-dose CT scan of the lungs is recommended for people who have at least a 30-pack-year history of smoking and are a current smoker or have quit within the past 15 years. A pack year of smoking is smoking an average of 1 pack of cigarettes a day for 1 year (for example: 1 pack a day for 30 years or 2 packs a day for 15 years). Yearly screening should continue until the smoker has stopped smoking for at least 15 years. Yearly screening should be stopped for people who develop a health problem that would prevent them from having lung cancer treatment.  If you choose to drink alcohol, do not have more than  2 drinks per day. One drink is considered to be 12 ounces (355 mL) of beer, 5 ounces (148 mL) of wine, or 1.5 ounces (44 mL) of liquor.  Avoid use of street drugs. Do not share needles with anyone. Ask for help if you need support or instructions about stopping the use of drugs.  High blood pressure causes heart disease and increases the risk of stroke. Your blood pressure should be checked at least every 1-2 years. Ongoing high blood pressure should be treated with  medicines, if weight loss and exercise are not effective.  If you are 45-79 years old, ask your health care provider if you should take aspirin to prevent heart disease.  Diabetes screening involves taking a blood sample to check your fasting blood sugar level. This should be done once every 3 years, after age 45, if you are within normal weight and without risk factors for diabetes. Testing should be considered at a younger age or be carried out more frequently if you are overweight and have at least 1 risk factor for diabetes.  Colorectal cancer can be detected and often prevented. Most routine colorectal cancer screening begins at the age of 50 and continues through age 75. However, your health care provider may recommend screening at an earlier age if you have risk factors for colon cancer. On a yearly basis, your health care provider may provide home test kits to check for hidden blood in the stool. Use of a small camera at the end of a tube to directly examine the colon (sigmoidoscopy or colonoscopy) can detect the earliest forms of colorectal cancer. Talk to your health care provider about this at age 50, when routine screening begins. Direct exam of the colon should be repeated every 5-10 years through age 75, unless early forms of precancerous polyps or small growths are found.  People who are at an increased risk for hepatitis B should be screened for this virus. You are considered at high risk for hepatitis B if:  You were born in a country where hepatitis B occurs often. Talk with your health care provider about which countries are considered high risk.  Your parents were born in a high-risk country and you have not received a shot to protect against hepatitis B (hepatitis B vaccine).  You have HIV or AIDS.  You use needles to inject street drugs.  You live with, or have sex with, someone who has hepatitis B.  You are a man who has sex with other men (MSM).  You get hemodialysis  treatment.  You take certain medicines for conditions such as cancer, organ transplantation, and autoimmune conditions.  Hepatitis C blood testing is recommended for all people born from 1945 through 1965 and any individual with known risks for hepatitis C.  Practice safe sex. Use condoms and avoid high-risk sexual practices to reduce the spread of sexually transmitted infections (STIs). STIs include gonorrhea, chlamydia, syphilis, trichomonas, herpes, HPV, and human immunodeficiency virus (HIV). Herpes, HIV, and HPV are viral illnesses that have no cure. They can result in disability, cancer, and death.  If you are at risk of being infected with HIV, it is recommended that you take a prescription medicine daily to prevent HIV infection. This is called preexposure prophylaxis (PrEP). You are considered at risk if:  You are a man who has sex with other men (MSM) and have other risk factors.  You are a heterosexual man, are sexually active, and are at increased risk for HIV infection.    You take drugs by injection.  You are sexually active with a partner who has HIV.  Talk with your health care provider about whether you are at high risk of being infected with HIV. If you choose to begin PrEP, you should first be tested for HIV. You should then be tested every 3 months for as long as you are taking PrEP.  A one-time screening for abdominal aortic aneurysm (AAA) and surgical repair of large AAAs by ultrasound are recommended for men ages 32 to 67 years who are current or former smokers.  Healthy men should no longer receive prostate-specific antigen (PSA) blood tests as part of routine cancer screening. Talk with your health care provider about prostate cancer screening.  Testicular cancer screening is not recommended for adult males who have no symptoms. Screening includes self-exam, a health care provider exam, and other screening tests. Consult with your health care provider about any symptoms  you have or any concerns you have about testicular cancer.  Use sunscreen. Apply sunscreen liberally and repeatedly throughout the day. You should seek shade when your shadow is shorter than you. Protect yourself by wearing long sleeves, pants, a wide-brimmed hat, and sunglasses year round, whenever you are outdoors.  Once a month, do a whole-body skin exam, using a mirror to look at the skin on your back. Tell your health care provider about new moles, moles that have irregular borders, moles that are larger than a pencil eraser, or moles that have changed in shape or color.  Stay current with required vaccines (immunizations).  Influenza vaccine. All adults should be immunized every year.  Tetanus, diphtheria, and acellular pertussis (Td, Tdap) vaccine. An adult who has not previously received Tdap or who does not know his vaccine status should receive 1 dose of Tdap. This initial dose should be followed by tetanus and diphtheria toxoids (Td) booster doses every 10 years. Adults with an unknown or incomplete history of completing a 3-dose immunization series with Td-containing vaccines should begin or complete a primary immunization series including a Tdap dose. Adults should receive a Td booster every 10 years.  Varicella vaccine. An adult without evidence of immunity to varicella should receive 2 doses or a second dose if he has previously received 1 dose.  Human papillomavirus (HPV) vaccine. Males aged 68-21 years who have not received the vaccine previously should receive the 3-dose series. Males aged 22-26 years may be immunized. Immunization is recommended through the age of 6 years for any male who has sex with males and did not get any or all doses earlier. Immunization is recommended for any person with an immunocompromised condition through the age of 49 years if he did not get any or all doses earlier. During the 3-dose series, the second dose should be obtained 4-8 weeks after the first  dose. The third dose should be obtained 24 weeks after the first dose and 16 weeks after the second dose.  Zoster vaccine. One dose is recommended for adults aged 50 years or older unless certain conditions are present.  Measles, mumps, and rubella (MMR) vaccine. Adults born before [birth year] generally are considered immune to measles and mumps. Adults born in [birth year] or later should have 1 or more doses of MMR vaccine unless there is a contraindication to the vaccine or there is laboratory evidence of immunity to each of the three diseases. A routine second dose of MMR vaccine should be obtained at least 28 days after the first dose for students attending postsecondary  schools, health care workers, or international travelers. People who received inactivated measles vaccine or an unknown type of measles vaccine during 1963-1967 should receive 2 doses of MMR vaccine. People who received inactivated mumps vaccine or an unknown type of mumps vaccine before 1979 and are at high risk for mumps infection should consider immunization with 2 doses of MMR vaccine. Unvaccinated health care workers born before 1957 who lack laboratory evidence of measles, mumps, or rubella immunity or laboratory confirmation of disease should consider measles and mumps immunization with 2 doses of MMR vaccine or rubella immunization with 1 dose of MMR vaccine.  Pneumococcal 13-valent conjugate (PCV13) vaccine. When indicated, a person who is uncertain of his immunization history and has no record of immunization should receive the PCV13 vaccine. An adult aged 19 years or older who has certain medical conditions and has not been previously immunized should receive 1 dose of PCV13 vaccine. This PCV13 should be followed with a dose of pneumococcal polysaccharide (PPSV23) vaccine. The PPSV23 vaccine dose should be obtained at least 8 weeks after the dose of PCV13 vaccine. An adult aged 19 years or older who has certain medical conditions and  previously received 1 or more doses of PPSV23 vaccine should receive 1 dose of PCV13. The PCV13 vaccine dose should be obtained 1 or more years after the last PPSV23 vaccine dose.  Pneumococcal polysaccharide (PPSV23) vaccine. When PCV13 is also indicated, PCV13 should be obtained first. All adults aged 65 years and older should be immunized. An adult younger than age 65 years who has certain medical conditions should be immunized. Any person who resides in a nursing home or long-term care facility should be immunized. An adult smoker should be immunized. People with an immunocompromised condition and certain other conditions should receive both PCV13 and PPSV23 vaccines. People with human immunodeficiency virus (HIV) infection should be immunized as soon as possible after diagnosis. Immunization during chemotherapy or radiation therapy should be avoided. Routine use of PPSV23 vaccine is not recommended for American Indians, [place] Natives, or people younger than 65 years unless there are medical conditions that require PPSV23 vaccine. When indicated, people who have unknown immunization and have no record of immunization should receive PPSV23 vaccine. One-time revaccination 5 years after the first dose of PPSV23 is recommended for people aged 19-64 years who have chronic kidney failure, nephrotic syndrome, asplenia, or immunocompromised conditions. People who received 1-2 doses of PPSV23 before age 65 years should receive another dose of PPSV23 vaccine at age 65 years or later if at least 5 years have passed since the previous dose. Doses of PPSV23 are not needed for people immunized with PPSV23 at or after age 65 years.  Meningococcal vaccine. Adults with asplenia or persistent complement component deficiencies should receive 2 doses of quadrivalent meningococcal conjugate (MenACWY-D) vaccine. The doses should be obtained at least 2 months apart. Microbiologists working with certain meningococcal bacteria,  military recruits, people at risk during an outbreak, and people who travel to or live in countries with a high rate of meningitis should be immunized. A first-year college student up through age 21 years who is living in a residence hall should receive a dose if he did not receive a dose on or after his 16th birthday. Adults who have certain high-risk conditions should receive one or more doses of vaccine.  Hepatitis A vaccine. Adults who wish to be protected from this disease, have certain high-risk conditions, work with hepatitis A-infected animals, work in hepatitis A research labs, or   travel to or work in countries with a high rate of hepatitis A should be immunized. Adults who were previously unvaccinated and who anticipate close contact with an international adoptee during the first 60 days after arrival in the [place] from a country with a high rate of hepatitis A should be immunized.  Hepatitis B vaccine. Adults should be immunized if they wish to be protected from this disease, have certain high-risk conditions, may be exposed to blood or other infectious body fluids, are household contacts or sex partners of hepatitis B positive people, are clients or workers in certain care facilities, or travel to or work in countries with a high rate of hepatitis B.  Haemophilus influenzae type b (Hib) vaccine. A previously unvaccinated person with asplenia or sickle cell disease or having a scheduled splenectomy should receive 1 dose of Hib vaccine. Regardless of previous immunization, a recipient of a hematopoietic stem cell transplant should receive a 3-dose series 6-12 months after his successful transplant. Hib vaccine is not recommended for adults with HIV infection. Preventive Service / Frequency Ages 52 to 17  Blood pressure check.** / Every 1 to 2 years.  Lipid and cholesterol check.** / Every 5 years beginning at age 69.  Hepatitis C blood test.** / For any individual with known risks for  hepatitis C.  Skin self-exam. / Monthly.  Influenza vaccine. / Every year.  Tetanus, diphtheria, and acellular pertussis (Tdap, Td) vaccine.** / Consult your health care provider. 1 dose of Td every 10 years.  Varicella vaccine.** / Consult your health care provider.  HPV vaccine. / 3 doses over 6 months, if 72 or younger.  Measles, mumps, rubella (MMR) vaccine.** / You need at least 1 dose of MMR if you were born in 1957 or later. You may also need a second dose.  Pneumococcal 13-valent conjugate (PCV13) vaccine.** / Consult your health care provider.  Pneumococcal polysaccharide (PPSV23) vaccine.** / 1 to 2 doses if you smoke cigarettes or if you have certain conditions.  Meningococcal vaccine.** / 1 dose if you are age 35 to 60 years and a Market researcher living in a residence hall, or have one of several medical conditions. You may also need additional booster doses.  Hepatitis A vaccine.** / Consult your health care provider.  Hepatitis B vaccine.** / Consult your health care provider.  Haemophilus influenzae type b (Hib) vaccine.** / Consult your health care provider. Ages 35 to 8  Blood pressure check.** / Every 1 to 2 years.  Lipid and cholesterol check.** / Every 5 years beginning at age 57.  Lung cancer screening. / Every year if you are aged 44-80 years and have a 30-pack-year history of smoking and currently smoke or have quit within the past 15 years. Yearly screening is stopped once you have quit smoking for at least 15 years or develop a health problem that would prevent you from having lung cancer treatment.  Fecal occult blood test (FOBT) of stool. / Every year beginning at age 55 and continuing until age 73. You may not have to do this test if you get a colonoscopy every 10 years.  Flexible sigmoidoscopy** or colonoscopy.** / Every 5 years for a flexible sigmoidoscopy or every 10 years for a colonoscopy beginning at age 28 and continuing until age  1.  Hepatitis C blood test.** / For all people born from [birth year] through 1965 and any individual with known risks for hepatitis C.  Skin self-exam. / Monthly.  Influenza vaccine. / Every  year.  Tetanus, diphtheria, and acellular pertussis (Tdap/Td) vaccine.** / Consult your health care provider. 1 dose of Td every 10 years.  Varicella vaccine.** / Consult your health care provider.  Zoster vaccine.** / 1 dose for adults aged 53 years or older.  Measles, mumps, rubella (MMR) vaccine.** / You need at least 1 dose of MMR if you were born in 1957 or later. You may also need a second dose.  Pneumococcal 13-valent conjugate (PCV13) vaccine.** / Consult your health care provider.  Pneumococcal polysaccharide (PPSV23) vaccine.** / 1 to 2 doses if you smoke cigarettes or if you have certain conditions.  Meningococcal vaccine.** / Consult your health care provider.  Hepatitis A vaccine.** / Consult your health care provider.  Hepatitis B vaccine.** / Consult your health care provider.  Haemophilus influenzae type b (Hib) vaccine.** / Consult your health care provider. Ages 77 and over  Blood pressure check.** / Every 1 to 2 years.  Lipid and cholesterol check.**/ Every 5 years beginning at age 85.  Lung cancer screening. / Every year if you are aged 55-80 years and have a 30-pack-year history of smoking and currently smoke or have quit within the past 15 years. Yearly screening is stopped once you have quit smoking for at least 15 years or develop a health problem that would prevent you from having lung cancer treatment.  Fecal occult blood test (FOBT) of stool. / Every year beginning at age 33 and continuing until age 11. You may not have to do this test if you get a colonoscopy every 10 years.  Flexible sigmoidoscopy** or colonoscopy.** / Every 5 years for a flexible sigmoidoscopy or every 10 years for a colonoscopy beginning at age 28 and continuing until age 73.  Hepatitis C blood  test.** / For all people born from [birth year] through 1965 and any individual with known risks for hepatitis C.  Abdominal aortic aneurysm (AAA) screening.** / A one-time screening for ages 50 to 27 years who are current or former smokers.  Skin self-exam. / Monthly.  Influenza vaccine. / Every year.  Tetanus, diphtheria, and acellular pertussis (Tdap/Td) vaccine.** / 1 dose of Td every 10 years.  Varicella vaccine.** / Consult your health care provider.  Zoster vaccine.** / 1 dose for adults aged 34 years or older.  Pneumococcal 13-valent conjugate (PCV13) vaccine.** / Consult your health care provider.  Pneumococcal polysaccharide (PPSV23) vaccine.** / 1 dose for all adults aged 63 years and older.  Meningococcal vaccine.** / Consult your health care provider.  Hepatitis A vaccine.** / Consult your health care provider.  Hepatitis B vaccine.** / Consult your health care provider.  Haemophilus influenzae type b (Hib) vaccine.** / Consult your health care provider. **Family history and personal history of risk and conditions may change your health care provider's recommendations. Document Released: 04/10/2001 Document Revised: 02/17/2013 Document Reviewed: 07/10/2010 [facility] Patient Information 2015 [place], [place]. This information is not intended to replace advice given to you by your health care provider. Make sure you discuss any questions you have with your health care provider.

## 2014-04-06 NOTE — Progress Notes (Signed)
Jack DunksDavid Stuart  [id number] 05/25/1965 04/06/2014      Progress Note-Follow Up  Subjective  Chief Complaint  Chief Complaint  Patient presents with  . Annual Exam    HPI  Patient is a 49 y.o. male in today for routine medical care. Patient is in today for annual exam. Is noting some episodes of some chest discomfort off and on for the last couple of months. Denies any significant pattern and no other associated symptoms occur with the chest discomfort. Denies shortness of breath or palpitations at the time of chest pain. Otherwise feels well. Has been exercising less due to a torn anterior cruciate ligament in his left knee but it is starting to improve now so he believes he will start exercising more again. No recent illness. Denies CP/palp/SOB/HA/congestion/fevers/GI or GU c/o. Taking meds as prescribed  Past Medical History  Diagnosis Date  . Hyperlipidemia   . Preventative health care 03/26/2012  . Abdominal wall hernia 03/26/2012  . GERD (gastroesophageal reflux disease)   . Conjunctivitis 04/01/2013    Past Surgical History  Procedure Laterality Date  . Cholecystectomy  2010  . Hernia repair      as a kid  . Wisdom tooth extraction    . Epigastric hernia repair N/A 04/30/2012    Procedure: HERNIA REPAIR EPIGASTRIC ADULT;  Surgeon: Clovis Puhomas A. Cornett, MD;  Location: [facility];  Service: General;  Laterality: N/A;  . Insertion of mesh N/A 04/30/2012    Procedure: INSERTION OF MESH;  Surgeon: Clovis Puhomas A. Cornett, MD;  Location: [facility];  Service: General;  Laterality: N/A;    Family History  Problem Relation Age of Onset  . Heart disease Mother   . Diabetes Mother     type 2  . Glaucoma Mother   . Heart disease Father 2555    open heart surgery  . Hyperlipidemia Father   . Heart disease Maternal Grandmother   . Heart disease Paternal Grandmother   . Heart disease Paternal Grandfather   . Hyperlipidemia Sister   . Hyperlipidemia Sister     History   Social History  .  Marital Status: Married    Spouse Name: N/A    Number of Children: 2  . Years of Education: N/A   Occupational History  . [employer]    Social History Main Topics  . Smoking status: Never Smoker   . Smokeless tobacco: Never Used  . Alcohol Use: Yes     Comment: socially  . Drug Use: No  . Sexual Activity: Yes   Other Topics Concern  . Not on file   Social History Narrative    Current Outpatient Prescriptions on File Prior to Visit  Medication Sig Dispense Refill  . Calcium-Magnesium-Vitamin D (CALCIUM 500 PO) Take 1 tablet by mouth daily.     . cholestyramine (QUESTRAN) 4 G packet Take 1 packet by mouth 2 (two) times daily. 180 each 1  . Coenzyme Q10 (CO Q 10 PO) Take 1 tablet by mouth daily.     Marland Kitchen. KRILL OIL PO Take 1 tablet by mouth daily.    . Multiple Vitamin (MULTIVITAMIN) tablet Take 1 tablet by mouth daily.    . Nutritional Supplements (JUICE PLUS FIBRE PO) Take 2 tablets by mouth daily.     Marland Kitchen. omeprazole (PRILOSEC) 20 MG capsule Take 20 mg by mouth daily.    . vitamin B-12 (CYANOCOBALAMIN) 500 MCG tablet Take 500 mcg by mouth daily.    . vitamin C (ASCORBIC ACID) 500  MG tablet Take 500 mg by mouth daily.     No current facility-administered medications on file prior to visit.    No Known Allergies  Review of Systems  Review of Systems  Constitutional: Negative for fever, chills and malaise/fatigue.  HENT: Negative for congestion, hearing loss and nosebleeds.   Eyes: Negative for discharge.  Respiratory: Negative for cough, sputum production, shortness of breath and wheezing.   Cardiovascular: Negative for chest pain, palpitations and leg swelling.  Gastrointestinal: Negative for heartburn, nausea, vomiting, abdominal pain, diarrhea, constipation and blood in stool.  Genitourinary: Negative for dysuria, urgency, frequency and hematuria.  Musculoskeletal: Negative for myalgias, back pain and falls.  Skin: Negative for rash.  Neurological: Negative for  dizziness, tremors, sensory change, focal weakness, loss of consciousness, weakness and headaches.  Endo/Heme/Allergies: Negative for polydipsia. Does not bruise/bleed easily.  Psychiatric/Behavioral: Negative for depression and suicidal ideas. The patient is not nervous/anxious and does not have insomnia.     Objective  BP 108/72 mmHg  Pulse 70  Temp(Src) 97.8 F (36.6 C) (Oral)  Ht  (1.88 m)  Wt 204 lb (92.534 kg)  BMI 26.18 kg/m2  SpO2 95%  Physical Exam  Physical Exam  Constitutional: He is oriented to person, place, and time and well-developed, well-nourished, and in no distress. No distress.  HENT:  Head: Normocephalic and atraumatic.  Eyes: Conjunctivae are normal.  Neck: Neck supple. No thyromegaly present.  Cardiovascular: Normal rate, regular rhythm and normal heart sounds.   No murmur heard. Pulmonary/Chest: Effort normal and breath sounds normal. No respiratory distress.  Abdominal: He exhibits no distension and no mass. There is no tenderness.  Musculoskeletal: He exhibits no edema.  Neurological: He is alert and oriented to person, place, and time.  Skin: Skin is warm.  Psychiatric: Memory, affect and judgment normal.    Lab Results  Component Value Date   TSH 1.878 03/31/2013   Lab Results  Component Value Date   WBC 5.9 03/31/2013   HGB 15.0 03/31/2013   HCT 42.7 03/31/2013   MCV 90.1 03/31/2013   PLT 231 03/31/2013   Lab Results  Component Value Date   CREATININE 0.89 03/31/2013   BUN 12 03/31/2013   NA 140 03/31/2013   K 4.4 03/31/2013   CL 106 03/31/2013   CO2 26 03/31/2013   Lab Results  Component Value Date   ALT 36 03/31/2013   AST 21 03/31/2013   ALKPHOS 61 03/31/2013   BILITOT 0.9 03/31/2013   Lab Results  Component Value Date   CHOL 169 03/31/2013   Lab Results  Component Value Date   HDL 39* 03/31/2013   Lab Results  Component Value Date   LDLCALC 104* 03/31/2013   Lab Results  Component Value Date   TRIG 131  03/31/2013   Lab Results  Component Value Date   CHOLHDL 4.3 03/31/2013     Assessment & Plan  GERD (gastroesophageal reflux disease) Avoid offending foods, start probiotics. Do not eat large meals in late evening and consider raising head of bed.    Hyperlipidemia Tolerating statin, encouraged heart healthy diet, avoid trans fats, minimize simple carbs and saturated fats. Increase exercise as tolerated. 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   Chest tightness Referred to cardiology for further consideration. Seek care if pain returns and does not resolve   Preventative health care Patient encouraged  to maintain heart healthy diet, regular exercise, adequate sleep. Consider daily probiotics. Take medications as prescribed. Labs reviewed. Referred to gastroenterology for screening colonoscopy

## 2014-04-07 LAB — LIPID PANEL
Cholesterol: 208 mg/dL — ABNORMAL HIGH (ref 0–200)
HDL: 40.7 mg/dL (ref >39.00)
NonHDL: 167.3
Total CHOL/HDL Ratio: 5
Triglycerides: 248 mg/dL — ABNORMAL HIGH (ref 0.0–149.0)
VLDL: 49.6 mg/dL — ABNORMAL HIGH (ref 0.0–40.0)

## 2014-04-07 LAB — CBC
HEMATOCRIT: 44.7 % (ref 39.0–52.0)
Hemoglobin: 15.1 g/dL (ref 13.0–17.0)
MCHC: 33.8 g/dL (ref 30.0–36.0)
MCV: 92.5 fl (ref 78.0–100.0)
Platelets: 254 10*3/uL (ref 150.0–400.0)
RBC: 4.84 Mil/uL (ref 4.22–5.81)
RDW: 13.6 % (ref 11.5–15.5)
WBC: 9.2 10*3/uL (ref 4.0–10.5)

## 2014-04-07 LAB — COMPREHENSIVE METABOLIC PANEL
ALT: 27 U/L (ref 0–53)
AST: 21 U/L (ref 0–37)
Albumin: 4.3 g/dL (ref 3.5–5.2)
Alkaline Phosphatase: 65 U/L (ref 39–117)
BUN: 15 mg/dL (ref 6–23)
CALCIUM: 9.5 mg/dL (ref 8.4–10.5)
CHLORIDE: 104 meq/L (ref 96–112)
CO2: 25 mEq/L (ref 19–32)
CREATININE: 1.01 mg/dL (ref 0.40–1.50)
GFR: 83.02 mL/min (ref >60.00)
Glucose, Bld: 78 mg/dL (ref 70–99)
POTASSIUM: 4.3 meq/L (ref 3.5–5.1)
SODIUM: 137 meq/L (ref 135–145)
TOTAL PROTEIN: 7 g/dL (ref 6.0–8.3)
Total Bilirubin: 1.1 mg/dL (ref 0.2–1.2)

## 2014-04-07 LAB — LDL CHOLESTEROL, DIRECT: LDL DIRECT: 131 mg/dL

## 2014-04-07 LAB — TSH: TSH: 1.35 u[IU]/mL (ref 0.35–4.50)

## 2014-04-08 ENCOUNTER — Telehealth: Payer: Self-pay | Admitting: Family Medicine

## 2014-04-08 NOTE — Telephone Encounter (Signed)
Caller name: Benna DunksMarshall, Vincen Relation to pt: self Call back number: [phone]   Reason for call:  Pt returning your call regarding lab results

## 2014-04-09 ENCOUNTER — Telehealth: Payer: Self-pay | Admitting: Family Medicine

## 2014-04-09 DIAGNOSIS — E785 Hyperlipidemia, unspecified: Secondary | ICD-10-CM

## 2014-04-09 NOTE — Telephone Encounter (Signed)
Lipid lab entered for lab appointment on 07/14/14.

## 2014-04-09 NOTE — Telephone Encounter (Signed)
Patient has been contacted and informed of his lab results.

## 2014-04-16 ENCOUNTER — Ambulatory Visit (INDEPENDENT_AMBULATORY_CARE_PROVIDER_SITE_OTHER): Payer: 59 | Admitting: Cardiovascular Disease

## 2014-04-16 ENCOUNTER — Encounter: Payer: Self-pay | Admitting: Cardiovascular Disease

## 2014-04-16 VITALS — BP 100/72 | HR 73 | Ht 74.0 in | Wt 202.1 lb

## 2014-04-16 DIAGNOSIS — R0789 Other chest pain: Secondary | ICD-10-CM | POA: Insufficient documentation

## 2014-04-16 DIAGNOSIS — E785 Hyperlipidemia, unspecified: Secondary | ICD-10-CM

## 2014-04-16 MED ORDER — ATORVASTATIN CALCIUM 40 MG PO TABS: 40.0000 mg | ORAL_TABLET | Freq: Every day | ORAL | Status: DC

## 2014-04-16 NOTE — Progress Notes (Signed)
Cardiology Office Note   Date:  04/16/2014   ID:  Jack Stuart, DOB 05/21/1965, MRN [id number]  PCP:  Danise EdgeBLYTH, STACEY, MD  Cardiologist:   Vesta MixerNahser, Toran Murch J, MD   Chief Complaint  Patient presents with  . Chest Pain  . Hyperlipidemia      History of Present Illness: Jack DunksDavid Cumby is a 49 y.o. male who is referred by Dr. Abner GreenspanBlyth for further evaluation of chest pain. He recently started doing CrossFit. He has some tightness when he is really exerting himself.  He has noticed chest tightness / pain with full out exertiion Center of chest , no radiation Lasts 1 minute.   Has occurred several times.  Does not occur with normal work outs - just the most intense CrossFit work outs  + family hx of CAD, paternal grandparents had CAD Father had MI in his 6050s Mother has CAD  Is iin-between jobs at present .     Past Medical History  Diagnosis Date  . Hyperlipidemia   . Preventative health care 03/26/2012  . Abdominal wall hernia 03/26/2012  . GERD (gastroesophageal reflux disease)   . Conjunctivitis 04/01/2013    Past Surgical History  Procedure Laterality Date  . Cholecystectomy  2010  . Hernia repair      as a kid  . Wisdom tooth extraction    . Epigastric hernia repair N/A 04/30/2012    Procedure: HERNIA REPAIR EPIGASTRIC ADULT;  Surgeon: Clovis Puhomas A. Cornett, MD;  Location: [facility];  Service: General;  Laterality: N/A;  . Insertion of mesh N/A 04/30/2012    Procedure: INSERTION OF MESH;  Surgeon: Clovis Puhomas A. Cornett, MD;  Location: [facility];  Service: General;  Laterality: N/A;     Current Outpatient Prescriptions  Medication Sig Dispense Refill  . cholestyramine (QUESTRAN) 4 G packet Take 1 packet by mouth 2 (two) times daily. 180 each 1  . Coenzyme Q10 (CO Q 10 PO) Take 1 tablet by mouth daily.     Marland Kitchen. KRILL OIL PO Take 1 tablet by mouth daily.    . Multiple Vitamin (MULTIVITAMIN) tablet Take 1 tablet by mouth daily.    Marland Kitchen. omeprazole (PRILOSEC) 20 MG capsule Take 20 mg by mouth  daily.    . vitamin C (ASCORBIC ACID) 500 MG tablet Take 500 mg by mouth daily.     No current facility-administered medications for this visit.    Allergies:   Review of patient's allergies indicates no known allergies.    Social History:  The patient  reports that he has never smoked. He has never used smokeless tobacco. He reports that he drinks alcohol. He reports that he does not use illicit drugs.   Family History:  The patient's family history includes Diabetes in his mother; Glaucoma in his mother; Heart disease in his maternal grandmother, mother, paternal grandfather, paternal grandmother, and paternal uncle; Heart disease (age of onset: 1455) in his father; Hyperlipidemia in his father, sister, and sister.    ROS:  Please see the history of present illness.    Review of Systems: Constitutional:  denies fever, chills, diaphoresis, appetite change and fatigue.  HEENT: denies photophobia, eye pain, redness, hearing loss, ear pain, congestion, sore throat, rhinorrhea, sneezing, neck pain, neck stiffness and tinnitus.  Respiratory: denies SOB, DOE, cough, chest tightness, and wheezing.  Has allergies on occasion   Cardiovascular: admits to chest pain, palpitations and leg swelling.  Gastrointestinal: denies nausea, vomiting, abdominal pain, diarrhea, constipation, blood in stool.  Genitourinary: denies dysuria, urgency,  frequency, hematuria, flank pain and difficulty urinating.  Musculoskeletal: denies  myalgias, back pain, joint swelling, arthralgias and gait problem.   Skin: denies pallor, rash and wound.  Neurological: denies dizziness, seizures, syncope, weakness, light-headedness, numbness and headaches.   Hematological: denies adenopathy, easy bruising, personal or family bleeding history.  Psychiatric/ Behavioral: denies suicidal ideation, mood changes, confusion, nervousness, sleep disturbance and agitation.       All other systems are reviewed and negative.     PHYSICAL EXAM: VS:  Ht  (1.88 m)  Wt 202 lb 1.9 oz (91.681 kg)  BMI 25.94 kg/m2 , BMI Body mass index is 25.94 kg/(m^2). GEN: Well nourished, well developed, in no acute distress HEENT: normal Neck: no JVD, carotid bruits, or masses Cardiac: RRR; no murmurs, rubs, or gallops,no edema  Respiratory:  clear to auscultation bilaterally, normal work of breathing GI: soft, nontender, nondistended, + BS MS: no deformity or atrophy Skin: warm and dry, no rash Neuro:  Strength and sensation are intact Psych: normal   EKG:  EKG is not ordered today. The ekg ordered  Feb. 9, 2016  demonstrates NSR , no ST or T wave changes    Recent Labs: 04/06/2014: ALT 27; BUN 15; Creatinine 1.01; Hemoglobin 15.1; Platelets 254.0; Potassium 4.3; Sodium 137; TSH 1.35    Lipid Panel    Component Value Date/Time   CHOL 208* 04/06/2014 1738   TRIG 248.0* 04/06/2014 1738   HDL 40.70 04/06/2014 1738   CHOLHDL 5 04/06/2014 1738   VLDL 49.6* 04/06/2014 1738   LDLCALC 104* 03/31/2013 0909   LDLDIRECT 131.0 04/06/2014 1738      Wt Readings from Last 3 Encounters:  04/16/14 202 lb 1.9 oz (91.681 kg)  04/06/14 204 lb (92.534 kg)  09/02/13 203 lb (92.08 kg)      Other studies Reviewed: Additional studies/ records that were reviewed today include: . Review of the above records demonstrates:    ASSESSMENT AND PLAN:  1.  Chest tightness: Patient presents with episodes of chest tightness at maximal exercise. He's been doing Crossfit workouts.  He has a history of hyperlipidemia. His LDL is 131. He is a fairly strong family history of early coronary artery disease.  With a long discussion about coronary artery disease and the risk of myocardial infarction. We discussed the fact that stress testing does not provide any predictive information given his symptoms and his history, I would like to do a stress Myoview study.  2. Hyperlipidemia: His LDL is 131. He eats a fairly healthy diet although  admits that there is some room for improvement. His father had his first myocardial infarction at age 25.  I think that we should be very aggressive with his cholesterol management. I will obtain for goal LDL of 70 or below. We'll start him on atorvastatin 40 mg a day. I'll see him again in 3 months for follow-up office visit, fasting basic metabolic profile, lipid profile and liver enzymes.   Current medicines are reviewed at length with the patient today.  The patient does not have concerns regarding medicines.  The following changes have been made:  Add atrovastatin 40 .    Disposition:   FU with me in 3 months     Signed, Minyon Billiter, Deloris Ping, MD  04/16/2014 9:04 AM    [facility] [address] [place], [place], [name]  [address] Phone: [phone]; Fax: [phone]

## 2014-04-16 NOTE — Patient Instructions (Addendum)
Your physician has recommended you make the following change in your medication:  START Atorvastatin 40 mg once daily  Your physician has requested that you have en exercise stress myoview. For further information please visit [email or web address]. Please follow instruction sheet, as given.  Your physician recommends that you schedule a follow-up appointment in: 3 months with Dr. Elease HashimotoNahser.   Your physician recommends that you return for lab work in: 3 months on the day of or a few days before your office visit with Dr. Elease HashimotoNahser.  You will need to FAST for this appointment - nothing to eat or drink after midnight the night before except water.

## 2014-04-18 ENCOUNTER — Encounter: Payer: Self-pay | Admitting: Family Medicine

## 2014-04-18 NOTE — Assessment & Plan Note (Signed)
Referred to cardiology for further consideration. Seek care if pain returns and does not resolve

## 2014-04-18 NOTE — Assessment & Plan Note (Signed)
ACL torn in left knee is improving with rest over last couple of months.

## 2014-04-18 NOTE — Assessment & Plan Note (Signed)
Patient encouraged to maintain heart healthy diet, regular exercise, adequate sleep. Consider daily probiotics. Take medications as prescribed. Labs reviewed. Referred to gastroenterology for screening colonoscopy 

## 2014-04-18 NOTE — Assessment & Plan Note (Signed)
Tolerating statin, encouraged heart healthy diet, avoid trans fats, minimize simple carbs and saturated fats. Increase exercise as tolerated. Tolerating Cholestyramine

## 2014-04-18 NOTE — Assessment & Plan Note (Signed)
Avoid offending foods, start probiotics. Do not eat large meals in late evening and consider raising head of bed.  

## 2014-04-28 ENCOUNTER — Ambulatory Visit (HOSPITAL_COMMUNITY): Payer: 59 | Attending: Cardiovascular Disease | Admitting: Radiology

## 2014-04-28 DIAGNOSIS — E785 Hyperlipidemia, unspecified: Secondary | ICD-10-CM | POA: Insufficient documentation

## 2014-04-28 DIAGNOSIS — R0789 Other chest pain: Secondary | ICD-10-CM

## 2014-04-28 MED ORDER — TECHNETIUM TC 99M SESTAMIBI GENERIC - CARDIOLITE
10.0000 | Freq: Once | INTRAVENOUS | Status: AC | PRN
  Administered 2014-04-28: 10 via INTRAVENOUS

## 2014-04-28 MED ORDER — TECHNETIUM TC 99M SESTAMIBI GENERIC - CARDIOLITE
30.0000 | Freq: Once | INTRAVENOUS | Status: AC | PRN
  Administered 2014-04-28: 30 via INTRAVENOUS

## 2014-04-28 NOTE — Progress Notes (Signed)
[facility] SITE 3 NUCLEAR MED [address] [name], [place] [address] [phone]    Cardiology Nuclear Med Study  [name] is a 49 y.o. male     MRN : [id number]     DOB: 04/07/1965  Procedure Date: 04/28/2014  Nuclear Med Background Indication for Stress Test:  Evaluation for Ischemia History:  No known CAD Cardiac Risk Factors: Lipids  Symptoms:  Chest Pain with Exertion   Nuclear Pre-Procedure Caffeine/Decaff Intake:  None> 12 hrs NPO After: 10:00pm   Lungs:  clear O2 Sat: 97% on room air. IV 0.9% NS with Angio Cath:  22g  IV Site: R Wrist x 1, tolerated well IV Started by:  Jack HongPatsy Khristian Phillippi, RN  Chest Size (in):  44 Cup Size: n/a  Height: 6\' 2"  (1.88 m)  Weight:  199 lb (90.266 kg)  BMI:  Body mass index is 25.54 kg/(m^2). Tech Comments:  N/A    Nuclear Med Study 1 or 2 day study: 1 day  Stress Test Type:  Stress  Reading MD: N/A  Order Authorizing Provider:  Kristeen MissPhilip Nahser, MD  Resting Radionuclide: Technetium 6452m Sestamibi  Resting Radionuclide Dose: 11.0 mCi   Stress Radionuclide:  Technetium 5452m Sestamibi  Stress Radionuclide Dose: 33.0 mCi           Stress Protocol Rest HR: 75 Stress HR: 164  Rest BP: 125/93 Stress BP: 169/67  Exercise Time (min): 12:00 METS: 13.4   Predicted Max HR: 170 bpm % Max HR: 96.47 bpm Rate Pressure Product: [id number]   Dose of Adenosine (mg):  n/a Dose of Lexiscan: n/a mg  Dose of Atropine (mg): n/a Dose of Dobutamine: n/a mcg/kg/min (at max HR)  Stress Test Technologist: Jack ChimesSharon Stuart, [name]  Nuclear Technologist:  Jack Stuart, CNMT     Rest Procedure:  Myocardial perfusion imaging was performed at rest 45 minutes following the intravenous administration of Technetium 6552m Sestamibi. Rest ECG: Normal sinus rhythm. Diffuse J-point elevation.  Stress Procedure:  The patient exercised on the treadmill utilizing the Bruce Protocol for 12:00 minutes. The patient stopped due to fatigue and denied any chest pain.   Technetium 3152m Sestamibi was injected at peak exercise and myocardial perfusion imaging was performed after a brief delay. Stress ECG: No significant change from baseline ECG  QPS Raw Data Images:  Normal; no motion artifact; normal heart/lung ratio. Stress Images:  No significant abnormalities noted Rest Images:  Quality of the rest images is not optimal. It is adequate. There is no definite abnormality. Subtraction (SDS):  No evidence of ischemia. Transient Ischemic Dilatation (Normal <1.22):  0.86 Lung/Heart Ratio (Normal <0.45):  0.25  Quantitative Gated Spect Images QGS EDV:  116 ml QGS ESV:  46 ml  Impression Exercise Capacity:  Very good exercise capacity BP Response:  Normal blood pressure response. Clinical Symptoms:  No significant symptoms noted. ECG Impression:  No significant ST segment change suggestive of ischemia. Comparison with Prior Nuclear Study: No images to compare  Overall Impression:  Normal stress nuclear study. There is no scar or ischemia. This is a low risk scan. There is very good exercise tolerance.  LV Ejection Fraction: 60%.  LV Wall Motion:  Normal Wall Motion   Jerral BonitoJeff Katz, MD

## 2014-04-30 ENCOUNTER — Encounter: Payer: Self-pay | Admitting: Physician Assistant

## 2014-04-30 ENCOUNTER — Ambulatory Visit (INDEPENDENT_AMBULATORY_CARE_PROVIDER_SITE_OTHER): Payer: 59 | Admitting: Physician Assistant

## 2014-04-30 VITALS — BP 102/66 | HR 91 | Temp 97.6°F | Resp 16 | Ht 74.0 in | Wt 199.4 lb

## 2014-04-30 DIAGNOSIS — S99929A Unspecified injury of unspecified foot, initial encounter: Secondary | ICD-10-CM

## 2014-04-30 DIAGNOSIS — W19XXXA Unspecified fall, initial encounter: Secondary | ICD-10-CM | POA: Insufficient documentation

## 2014-04-30 DIAGNOSIS — S39012A Strain of muscle, fascia and tendon of lower back, initial encounter: Secondary | ICD-10-CM

## 2014-04-30 DIAGNOSIS — S99919A Unspecified injury of unspecified ankle, initial encounter: Secondary | ICD-10-CM

## 2014-04-30 NOTE — Progress Notes (Signed)
Patient presents to clinic today c/o pain in bilateral heels in lower back after sustaining a fall on the stairs at his home yesterday.  Denies dizziness or lightheadedness prior to event. Patient states he was walking down his stairs while wearing socks. States he slipped and fell. Denies head trauma or loss of consciousness.   Has had some minor pain in lower right back and hip. Also pain in the heels and right foot. Denies swelling or bruising. Was able to go to cross fit today. Has not taken anything for his symptoms.  Past Medical History  Diagnosis Date  . Hyperlipidemia   . Preventative health care 03/26/2012  . Abdominal wall hernia 03/26/2012  . GERD (gastroesophageal reflux disease)   . Conjunctivitis 04/01/2013    Current Outpatient Prescriptions on File Prior to Visit  Medication Sig Dispense Refill  . atorvastatin (LIPITOR) 40 MG tablet Take 1 tablet (40 mg total) by mouth daily. 31 tablet 11  . cholestyramine (QUESTRAN) 4 G packet Take 1 packet by mouth 2 (two) times daily. 180 each 1  . Coenzyme Q10 (CO Q 10 PO) Take 1 tablet by mouth daily.     Marland Kitchen KRILL OIL PO Take 1 tablet by mouth daily.    . Multiple Vitamin (MULTIVITAMIN) tablet Take 1 tablet by mouth daily.    Marland Kitchen omeprazole (PRILOSEC) 20 MG capsule Take 20 mg by mouth daily.    . vitamin C (ASCORBIC ACID) 500 MG tablet Take 500 mg by mouth daily.     No current facility-administered medications on file prior to visit.    No Known Allergies  Family History  Problem Relation Age of Onset  . Heart disease Mother   . Diabetes Mother     type 2  . Glaucoma Mother   . Heart disease Father 53    open heart surgery  . Hyperlipidemia Father   . Heart disease Maternal Grandmother   . Heart disease Paternal Grandmother   . Heart disease Paternal Grandfather   . Hyperlipidemia Sister   . Hyperlipidemia Sister   . Heart disease Paternal Uncle     History   Social History  . Marital Status: Married    Spouse  Name: N/A  . Number of Children: 2  . Years of Education: N/A   Occupational History  . [employer]    Social History Main Topics  . Smoking status: Never Smoker   . Smokeless tobacco: Never Used  . Alcohol Use: Yes     Comment: socially  . Drug Use: No  . Sexual Activity: Yes   Other Topics Concern  . None   Social History Narrative    Review of Systems - See HPI.  All other ROS are negative.  BP 102/66 mmHg  Pulse 91  Temp(Src) 97.6 F (36.4 C) (Oral)  Resp 16  Ht  (1.88 m)  Wt 199 lb 6 oz (90.436 kg)  BMI 25.59 kg/m2  SpO2 96%  Physical Exam  Constitutional: He is oriented to person, place, and time and well-developed, well-nourished, and in no distress.  HENT:  Head: Normocephalic and atraumatic.  Cardiovascular: Normal rate, regular rhythm, normal heart sounds and intact distal pulses.   Pulmonary/Chest: Effort normal and breath sounds normal. No respiratory distress. He has no wheezes. He has no rales. He exhibits no tenderness.  Musculoskeletal:       Lumbar back: He exhibits tenderness. He exhibits no bony tenderness and no spasm.  Right foot: There is tenderness. There is normal range of motion, no bony tenderness, no swelling and normal capillary refill.       Left foot: There is tenderness. There is normal range of motion, no bony tenderness, no swelling and normal capillary refill.  Neurological: He is alert and oriented to person, place, and time.  Skin: Skin is warm and dry. No rash noted.  Psychiatric: Affect normal.  Vitals reviewed.   Recent Results (from the past 2160 hour(s))  Lipid panel     Status: Abnormal   Collection Time: 04/06/14  5:38 PM  Result Value Ref Range   Cholesterol 208 (H) 0 - 200 mg/dL    Comment: ATP III Classification       Desirable:  < 200 mg/dL               Borderline High:  200 - 239 mg/dL          High:  > = 829 mg/dL   Triglycerides 562.1 (H) 0.0 - 149.0 mg/dL    Comment: Normal:  <308 mg/dLBorderline  High:  150 - 199 mg/dL   HDL 65.78 >46.96 mg/dL   VLDL 29.5 (H) 0.0 - 28.4 mg/dL   Total CHOL/HDL Ratio 5     Comment:                Men          Women1/2 Average Risk     3.4          3.3Average Risk          5.0          4.42X Average Risk          9.6          7.13X Average Risk          15.0          11.0                       NonHDL 167.30     Comment: NOTE:  Non-HDL goal should be 30 mg/dL higher than patient's LDL goal (i.e. LDL goal of < 70 mg/dL, would have non-HDL goal of < 100 mg/dL)  CBC     Status: None   Collection Time: 04/06/14  5:38 PM  Result Value Ref Range   WBC 9.2 4.0 - 10.5 K/uL   RBC 4.84 4.22 - 5.81 Mil/uL   Platelets 254.0 150.0 - 400.0 K/uL   Hemoglobin 15.1 13.0 - 17.0 g/dL   HCT 13.2 44.0 - 10.2 %   MCV 92.5 78.0 - 100.0 fl   MCHC 33.8 30.0 - 36.0 g/dL   RDW 72.5 36.6 - 44.0 %  TSH     Status: None   Collection Time: 04/06/14  5:38 PM  Result Value Ref Range   TSH 1.35 0.35 - 4.50 uIU/mL  Comprehensive metabolic panel     Status: None   Collection Time: 04/06/14  5:38 PM  Result Value Ref Range   Sodium 137 135 - 145 mEq/L   Potassium 4.3 3.5 - 5.1 mEq/L   Chloride 104 96 - 112 mEq/L   CO2 25 19 - 32 mEq/L   Glucose, Bld 78 70 - 99 mg/dL   BUN 15 6 - 23 mg/dL   Creatinine, Ser 3.47 0.40 - 1.50 mg/dL   Total Bilirubin 1.1 0.2 - 1.2 mg/dL   Alkaline Phosphatase 65 39 - 117 U/L   AST  21 0 - 37 U/L   ALT 27 0 - 53 U/L   Total Protein 7.0 6.0 - 8.3 g/dL   Albumin 4.3 3.5 - 5.2 g/dL   Calcium 9.5 8.4 - 30.810.5 mg/dL   GFR 65.7883.02 >46.96>60.00 mL/min  LDL cholesterol, direct     Status: None   Collection Time: 04/06/14  5:38 PM  Result Value Ref Range   Direct LDL 131.0 mg/dL    Comment: Optimal:  <295<100 mg/dLNear or Above Optimal:  100-129 mg/dLBorderline High:  130-159 mg/dLHigh:  160-189 mg/dLVery High:  >190 mg/dL    Assessment/Plan: Fall Very low suspicion for fracture. Suspect pain is muscular in nature. Possible minor contusion to right foot, but no  major ecchymosis noted. Supportive measures discussed with patient. Encouraged topical as cream and RICE therapy. Call or return to clinic if symptoms not improving over the next few days.

## 2014-04-30 NOTE — Patient Instructions (Signed)
Please get plenty of rest.   Wear supportive shoes. Alternate tylenol and ibuprofen as needed for pain. Apply moist heat to the area. Avoid heavy lifting or overexertion for a few days.  Follow-up inf symptoms are not improving.

## 2014-04-30 NOTE — Assessment & Plan Note (Signed)
Very low suspicion for fracture. Suspect pain is muscular in nature. Possible minor contusion to right foot, but no major ecchymosis noted. Supportive measures discussed with patient. Encouraged topical as cream and RICE therapy. Call or return to clinic if symptoms not improving over the next few days.

## 2014-04-30 NOTE — Progress Notes (Signed)
Pre visit review using our clinic review tool, if applicable. No additional management support is needed unless otherwise documented below in the visit note/SLS  

## 2014-05-06 DIAGNOSIS — S39012A Strain of muscle, fascia and tendon of lower back, initial encounter: Secondary | ICD-10-CM | POA: Insufficient documentation

## 2014-05-06 DIAGNOSIS — S99919A Unspecified injury of unspecified ankle, initial encounter: Secondary | ICD-10-CM | POA: Insufficient documentation

## 2014-05-06 DIAGNOSIS — S99929A Unspecified injury of unspecified foot, initial encounter: Secondary | ICD-10-CM

## 2014-05-06 NOTE — Assessment & Plan Note (Signed)
Very low suspicion for fracture. Suspect pain is muscular in nature. Possible minor contusion to right foot, but no major ecchymosis noted. Supportive measures discussed with patient. Encouraged topical as cream and RICE therapy. Call or return to clinic if symptoms not improving over the next few days.

## 2014-06-01 ENCOUNTER — Encounter: Payer: Self-pay | Admitting: Internal Medicine

## 2014-07-14 ENCOUNTER — Telehealth: Payer: Self-pay | Admitting: Nurse Practitioner

## 2014-07-14 ENCOUNTER — Other Ambulatory Visit (INDEPENDENT_AMBULATORY_CARE_PROVIDER_SITE_OTHER): Payer: 59

## 2014-07-14 DIAGNOSIS — E785 Hyperlipidemia, unspecified: Secondary | ICD-10-CM | POA: Diagnosis not present

## 2014-07-14 NOTE — Telephone Encounter (Signed)
Received call from patient who called to ask if fasting lab work appointment for tomorrow is needed; states PCP checked cholesterol today.  I advised patient that liver and basic metabolic panel is still needed and advised that we can do lab work on Monday 5/23 at appointment with Dr. Elease HashimotoNahser so that patient doesn't have to make an additional trip to our office.  Patient verbalized understanding and agreement and states he did not fast for today's cholesterol screening.  I noted understanding.

## 2014-07-15 ENCOUNTER — Other Ambulatory Visit: Payer: 59

## 2014-07-15 LAB — LIPID PANEL
Cholesterol: 181 mg/dL (ref 0–200)
HDL: 37.7 mg/dL — ABNORMAL LOW (ref >39.00)
LDL Cholesterol: 103 mg/dL — ABNORMAL HIGH (ref 0–99)
NonHDL: 143.3
Total CHOL/HDL Ratio: 5
Triglycerides: 200 mg/dL — ABNORMAL HIGH (ref 0.0–149.0)
VLDL: 40 mg/dL (ref 0.0–40.0)

## 2014-07-19 ENCOUNTER — Encounter: Payer: Self-pay | Admitting: Cardiovascular Disease

## 2014-07-19 ENCOUNTER — Ambulatory Visit (INDEPENDENT_AMBULATORY_CARE_PROVIDER_SITE_OTHER): Payer: 59 | Admitting: Cardiovascular Disease

## 2014-07-19 DIAGNOSIS — E785 Hyperlipidemia, unspecified: Secondary | ICD-10-CM | POA: Diagnosis not present

## 2014-07-19 NOTE — Patient Instructions (Signed)
Medication Instructions:  Your physician recommends that you continue on your current medications as directed. Please refer to the Current Medication list given to you today.   Labwork: TODAY - liver panel and basic metabolic panel  Testing/Procedures: None Ordered  Follow-Up: Your physician recommends that you schedule a follow-up appointment in: as needed with  Dr. Elease HashimotoNahser

## 2014-07-19 NOTE — Progress Notes (Signed)
Cardiology Office Note   Date:  07/19/2014   ID:  Jack Stuart, DOB February 09, 1966, MRN [id number]  PCP:  Danise Edge, MD  Cardiologist:   Vesta Mixer, MD   Chief Complaint  Patient presents with  . Follow-up    chest pain , hyperlipidemia      History of Present Illness: Jack Stuart is a 49 y.o. male who is referred by Dr. Abner Greenspan for further evaluation of chest pain. He recently started doing CrossFit. He has some tightness when he is really exerting himself.  He has noticed chest tightness / pain with full out exertiion Center of chest , no radiation Lasts 1 minute.   Has occurred several times.  Does not occur with normal work outs - just the most intense CrossFit work outs  + family hx of CAD, paternal grandparents had CAD Father had MI in his 65s Mother has CAD  Is iin-between jobs at present .    Jul 19, 2014:  Jack Stuart is feeling much better. He's not had any further episodes of chest discomfort. He did not want to take the atorvastatin.   Past Medical History  Diagnosis Date  . Hyperlipidemia   . Preventative health care 03/26/2012  . Abdominal wall hernia 03/26/2012  . GERD (gastroesophageal reflux disease)   . Conjunctivitis 04/01/2013    Past Surgical History  Procedure Laterality Date  . Cholecystectomy  2010  . Hernia repair      as a kid  . Wisdom tooth extraction    . Epigastric hernia repair N/A 04/30/2012    Procedure: HERNIA REPAIR EPIGASTRIC ADULT;  Surgeon: Clovis Pu. Cornett, MD;  Location: [facility];  Service: General;  Laterality: N/A;  . Insertion of mesh N/A 04/30/2012    Procedure: INSERTION OF MESH;  Surgeon: Clovis Pu. Cornett, MD;  Location: [facility];  Service: General;  Laterality: N/A;     Current Outpatient Prescriptions  Medication Sig Dispense Refill  . atorvastatin (LIPITOR) 40 MG tablet Take 1 tablet (40 mg total) by mouth daily. 31 tablet 11  . cholestyramine (QUESTRAN) 4 G packet Take 1 packet by mouth 2 (two) times daily. 180  each 1  . Coenzyme Q10 (CO Q 10 PO) Take 1 tablet by mouth daily.     Marland Kitchen KRILL OIL PO Take 1 tablet by mouth daily.    . Multiple Vitamin (MULTIVITAMIN) tablet Take 1 tablet by mouth daily.    Marland Kitchen omeprazole (PRILOSEC) 20 MG capsule Take 20 mg by mouth daily.    . vitamin C (ASCORBIC ACID) 500 MG tablet Take 500 mg by mouth daily.     No current facility-administered medications for this visit.    Allergies:   Review of patient's allergies indicates no known allergies.    Social History:  The patient  reports that he has never smoked. He has never used smokeless tobacco. He reports that he drinks alcohol. He reports that he does not use illicit drugs.   Family History:  The patient's family history includes Diabetes in his mother; Glaucoma in his mother; Heart disease in his maternal grandmother, mother, paternal grandfather, paternal grandmother, and paternal uncle; Heart disease (age of onset: 62) in his father; Hyperlipidemia in his father, sister, and sister.    ROS:  Please see the history of present illness.    Review of Systems: Constitutional:  denies fever, chills, diaphoresis, appetite change and fatigue.  HEENT: denies photophobia, eye pain, redness, hearing loss, ear pain, congestion, sore throat, rhinorrhea, sneezing,  neck pain, neck stiffness and tinnitus.  Respiratory: denies SOB, DOE, cough, chest tightness, and wheezing.  Has allergies on occasion   Cardiovascular: admits to chest pain, palpitations and leg swelling.  Gastrointestinal: denies nausea, vomiting, abdominal pain, diarrhea, constipation, blood in stool.  Genitourinary: denies dysuria, urgency, frequency, hematuria, flank pain and difficulty urinating.  Musculoskeletal: denies  myalgias, back pain, joint swelling, arthralgias and gait problem.   Skin: denies pallor, rash and wound.  Neurological: denies dizziness, seizures, syncope, weakness, light-headedness, numbness and headaches.   Hematological: denies  adenopathy, easy bruising, personal or family bleeding history.  Psychiatric/ Behavioral: denies suicidal ideation, mood changes, confusion, nervousness, sleep disturbance and agitation.       All other systems are reviewed and negative.    PHYSICAL EXAM: VS:  BP 108/70 mmHg  Pulse 77  Ht  (1.88 m)  Wt 90.719 kg (200 lb)  BMI 25.67 kg/m2 , BMI Body mass index is 25.67 kg/(m^2). GEN: Well nourished, well developed, in no acute distress HEENT: normal Neck: no JVD, carotid bruits, or masses Cardiac: RRR; no murmurs, rubs, or gallops,no edema  Respiratory:  clear to auscultation bilaterally, normal work of breathing GI: soft, nontender, nondistended, + BS MS: no deformity or atrophy Skin: warm and dry, no rash Neuro:  Strength and sensation are intact Psych: normal   EKG:  EKG is not ordered today. The ekg ordered  Feb. 9, 2016  demonstrates NSR , no ST or T wave changes    Recent Labs: 04/06/2014: ALT 27; BUN 15; Creatinine 1.01; Hemoglobin 15.1; Platelets 254.0; Potassium 4.3; Sodium 137; TSH 1.35    Lipid Panel    Component Value Date/Time   CHOL 181 07/14/2014 1534   TRIG 200.0* 07/14/2014 1534   HDL 37.70* 07/14/2014 1534   CHOLHDL 5 07/14/2014 1534   VLDL 40.0 07/14/2014 1534   LDLCALC 103* 07/14/2014 1534   LDLDIRECT 131.0 04/06/2014 1738      Wt Readings from Last 3 Encounters:  07/19/14 90.719 kg (200 lb)  04/30/14 90.436 kg (199 lb 6 oz)  04/28/14 90.266 kg (199 lb)      Other studies Reviewed: Additional studies/ records that were reviewed today include: . Review of the above records demonstrates:    ASSESSMENT AND PLAN:  1.  Chest tightness: Patient presents with episodes of chest tightness at maximal exercise. He's been doing Crossfit workouts.  He has a history of hyperlipidemia. His LDL was131 and has been lowered to 103.    He is a fairly strong family history of early coronary artery disease.  His stress myoview was normal .   I'll see  him on an as-needed basis.  2. Hyperlipidemia: His LDL is 131. He eats a fairly healthy diet although admits that there is some room for improvement. His father had his first myocardial infarction at age 15.  I think that we should be very aggressive with his cholesterol management. I will obtain for goal LDL of 70 or below. He's managed to decrease his LDL from 131-103 with A strenuous exercise program. He did not want to take a statin. I have advised him to talk further with his primary medical doctor. I'll see him again on an as-needed basis.   Current medicines are reviewed at length with the patient today.  The patient does not have concerns regarding medicines.  The following changes have been made:      Disposition:   FU with me as needed.     Signed, Hildegard Hlavac,  Deloris PingPhilip J, MD  07/19/2014 3:51 PM    [facility] [address], [place], [place]  [address] Phone: [phone]) [phone]; Fax: [phone]

## 2014-07-20 ENCOUNTER — Other Ambulatory Visit: Payer: Self-pay | Admitting: Family Medicine

## 2014-07-20 ENCOUNTER — Telehealth: Payer: Self-pay | Admitting: Family Medicine

## 2014-07-20 DIAGNOSIS — M25579 Pain in unspecified ankle and joints of unspecified foot: Secondary | ICD-10-CM

## 2014-07-20 LAB — HEPATIC FUNCTION PANEL
ALT: 26 U/L (ref 0–53)
AST: 29 U/L (ref 0–37)
Albumin: 4.5 g/dL (ref 3.5–5.2)
Alkaline Phosphatase: 77 U/L (ref 39–117)
Bilirubin, Direct: 0.2 mg/dL (ref 0.0–0.3)
Total Bilirubin: 1.5 mg/dL — ABNORMAL HIGH (ref 0.2–1.2)
Total Protein: 7.2 g/dL (ref 6.0–8.3)

## 2014-07-20 LAB — BASIC METABOLIC PANEL
BUN: 13 mg/dL (ref 6–23)
CO2: 25 meq/L (ref 19–32)
CREATININE: 0.99 mg/dL (ref 0.40–1.50)
Calcium: 9.5 mg/dL (ref 8.4–10.5)
Chloride: 103 mEq/L (ref 96–112)
GFR: 84.86 mL/min (ref >60.00)
Glucose, Bld: 71 mg/dL (ref 70–99)
POTASSIUM: 4.4 meq/L (ref 3.5–5.1)
Sodium: 136 mEq/L (ref 135–145)

## 2014-07-20 NOTE — Telephone Encounter (Signed)
Caller name: Jack DunksDavid Stuart Relationship to patient: self Can be reached: [phone] Pharmacy:  Reason for call: pt has been working out and has injured his left ankle. He is requesting referral to ortho/sports medicine that he went to before.

## 2014-07-22 ENCOUNTER — Encounter: Payer: Self-pay | Admitting: Family Medicine

## 2014-08-09 ENCOUNTER — Encounter: Payer: Self-pay | Admitting: Family Medicine

## 2014-08-09 ENCOUNTER — Ambulatory Visit (INDEPENDENT_AMBULATORY_CARE_PROVIDER_SITE_OTHER): Payer: 59 | Admitting: Family Medicine

## 2014-08-09 ENCOUNTER — Other Ambulatory Visit (INDEPENDENT_AMBULATORY_CARE_PROVIDER_SITE_OTHER): Payer: 59

## 2014-08-09 VITALS — BP 110/74 | HR 78 | Ht 74.0 in | Wt 200.0 lb

## 2014-08-09 DIAGNOSIS — M76822 Posterior tibial tendinitis, left leg: Secondary | ICD-10-CM | POA: Diagnosis not present

## 2014-08-09 DIAGNOSIS — M25572 Pain in left ankle and joints of left foot: Secondary | ICD-10-CM | POA: Diagnosis not present

## 2014-08-09 NOTE — Assessment & Plan Note (Signed)
HEP given , discussed heel lift, worked with ATC today.  Topical NSADs given. Discussed what activities to potentially avoid. Patient's will try this conservative therapy and then come back and see me again in 3 weeks. Patient did have a effusion of the joint and if this continues we will need to consider x-ray as well as possible aspiration. I do not think though patient has a talar dome injury. This does not correspond well with the signs and symptoms today.

## 2014-08-09 NOTE — Patient Instructions (Signed)
Good to see you Ice ankle 20 minutes after activity and before bed pennsaid pinkie amount topically 2 times daily as needed.  Exercises 3 times a week.  Heel lift in shoe  No jumping or running for 3 weeks.  Drop weight 50% and increase 10% a week.  See me again in 3 weeks.    Posterior Tibial Tendon Rupture with Rehab Tendons are soft tissues that connect muscle to bone. Tendons allow muscles to move the skeletal system. A complete tear of the posterior tibial tendon is known as a posterior tendon rupture. The posterior tibial tendon attaches the posterior muscles on the inner portion of the back of the lower leg (tibialis muscles) to foot. The posterior tibialis muscles help straighten (plantar flex) and rotate the foot inward (medially rotate) the foot. A posterior tibial rupture will result in a decreased ability to perform these tasks. SYMPTOMS   A "pop" or tear felt and/or heard in the area at the time of injury.  Pain, tenderness, inflammation, and/or bruising (contusion) around the medial inner side of the ankle.  Pain that worsens with dorsiflexion (opposite of plantar flexion) of the foot.  Decreased ankle strength.  Decrease in the prominence of the arch in the sole of the foot. CAUSES  Tendon ruptures occur when a force is placed on the tendon that is greater than it can withstand. Common mechanisms of injury include:  An event that places great stress on the tendon (jumping or starting a sprint).  Direct trauma to the ankle. RISK INCREASES WITH:  Sports that involve forceful and explosive plantar flexion (jumping or quick starts).  Poor strength and flexibility.  Previous injury to the posterior tibial tendon.  Corticosteroid injection into the posterior tibial tendon.  Obesity.  Poor vascular circulation. PREVENTION   Warm up and stretch properly before activity.  Allow for adequate recovery between workouts.  Maintain physical fitness:  Strength,  flexibility, and endurance.  Cardiovascular fitness.  Maintain a healthy body weight.  Arch supports (orthotics), if you have flat feet.  Limit ankle movement by taping, wearing a brace, or using compression bandages. PROGNOSIS  In order to have the highest likelihood of returning to your preinjury activity level, surgery is usually recommended, with 4 to 9 months of rehabilitation afterward.  RELATED COMPLICATIONS   Decreased ability to plantar flex.  Rerupture of the posterior tibial tendon.  Prolonged disability.  Flat feet.  Arthritis of the foot.  Risks of surgery: infection, bleeding, nerve damage, or damage to surrounding tissues. TREATMENT  Treatment initially involves resting from any activities that aggravate your symptoms. Ice, medication, and elevation can be used to help reduce pain and inflammation. In order to have the best results, surgery is recommended. Surgery involves sewing (suturing) the ends of the torn tendon back together. If your surgeon cannot repair the tendon, then a replacement (reconstruction) surgery may be performed to use another tendon to replace the function of the ruptured tendon. After surgery, the ankle is immobilized in order to allow the tendon to heal. After immobilization, it is important to perform strengthening and stretching exercises to help regain strength and a full range of motion. These exercises may be completed at home or with a therapist. MEDICATION   Nonsteroidal anti-inflammatory medications, such as aspirin and ibuprofen (do not take within 7 days before surgery), or other minor pain relievers, such as acetaminophen, are often recommended. Take these as directed by your caregiver. Contact your caregiver immediately if any bleeding, stomach upset, or  signs of an allergic reaction occur.  Pain relievers may be prescribed as necessary by your caregiver. Use only as directed and only as much as you need. SEEK MEDICAL CARE  IF:  Treatment seems to offer no benefit, or the condition worsens.  Any medications produce adverse side effects.  Any complications from surgery occur:  Pain, numbness, or coldness in the extremity operated upon.  Discoloration of the nail beds (they become blue or gray) of the extremity operated upon.  Signs of infections (fever, pain, inflammation, redness, or persistent bleeding). EXERCISES RANGE OF MOTION (ROM) AND STRETCHING EXERCISES - Posterior Tibial Tendon Rupture These exercises may help you when beginning to rehabilitate your injury. Your symptoms may resolve with or without further involvement from your physician, physical therapist, or athletic trainer. While completing these exercises, remember:   Restoring tissue flexibility helps normal motion to return to the joints. This allows healthier, less painful movement and activity.  An effective stretch should be held for at least 30 seconds.  A stretch should never be painful. You should only feel a gentle lengthening or release in the stretched tissue. STRETCH - Gastrocsoleus   Sit with your right / left leg extended. Holding onto both ends of a belt or towel, loop it around the ball of your foot.  Keeping your right / left ankle and foot relaxed and your knee straight, pull your foot and ankle toward you using the belt/towel.  You should feel a gentle stretch behind your calf or knee. Hold this position for __________ seconds. Repeat __________ times. Complete this stretch __________. times per day.  RANGE OF MOTION - Dorsi/Plantar Flexion  While sitting with your right / left knee straight, draw the top of your foot upwards by flexing your ankle. Then reverse the motion, pointing your toes downward.  Hold each position for __________ seconds.  After completing your first set of exercises, repeat this exercise with your knee bent. Repeat __________ times. Complete this exercise __________ times per day.  RANGE OF  MOTION - Ankle Plantar Flexion   Sit with your right / left leg crossed over your opposite knee.  Use your opposite hand to pull the top of your foot and toes toward you.  You should feel a gentle stretch on the top of your foot/ankle. Hold this position for __________ seconds. Repeat __________ times. Complete __________ times per day.  RANGE OF MOTION - Ankle Eversion   Sit with your right / left ankle crossed over your opposite knee.  Grip your foot with your opposite hand, placing your thumb on the top of your foot and your fingers across the bottom of your foot.  Gently push your foot downward with a slight rotation so your littlest toes rise slightly.  You should feel a gentle stretch on the inside of your ankle. Hold the stretch for __________ seconds. Repeat __________ times. Complete this exercise __________ times per day.  RANGE OF MOTION - Ankle Inversion   Sit with your right / left ankle crossed over your opposite knee.  Grip your foot with your opposite hand, placing your thumb on the bottom of your foot and your fingers across the top of your foot.  Gently pull your foot so the smallest toe comes toward you and your thumb pushes the inside of the ball of your foot away from you.  You should feel a gentle stretch on the outside of your ankle. Hold the stretch for __________ seconds. Repeat __________ times. Complete this exercise  __________ times per day.  RANGE OF MOTION - Ankle Alphabet  Imagine your right / left big toe is a pen.  Keeping your hip and knee still, write out the entire alphabet with your "pen." Make the letters as large as you can without increasing any discomfort. Repeat __________ times. Complete this exercise __________ times per day.  STRENGTHENING EXERCISES - Posterior Tibial Tendon Rupture These exercises may help you when beginning to rehabilitate your injury. They may resolve your symptoms with or without further involvement from your  physician, physical therapist, or athletic trainer. While completing these exercises, remember:   Muscles can gain both the endurance and the strength needed for everyday activities through controlled exercises.  Complete these exercises as instructed by your physician, physical therapist, or athletic trainer. Progress the resistance and repetitions only as guided. STRENGTH - Dorsiflexors  Secure a rubber exercise band/tubing to a fixed object (table, pole) and loop the other end around your right / left foot.  Sit on the floor facing the fixed object. The band/tubing should be slightly tense when your foot is relaxed.  Slowly draw your foot back toward you using your ankle and toes.  Hold this position for __________ seconds. Slowly release the tension in the band and return your foot to the starting position. Repeat __________ times. Complete this exercise __________ times per day.  STRENGTH - Plantar Flexors   Sit with your right / left leg extended. Holding onto both ends of a rubber exercise band/tubing, loop it around the ball of your foot. Keep a slight tension in the band.  Slowly push your toes away from you, pointing them downward.  Hold this position for __________ seconds. Return slowly, controlling the tension in the band/tubing. Repeat __________ times. Complete this exercise __________ times per day.  STRENGTH - Towel Curls  Sit in a chair positioned on a non-carpeted surface.  Place your foot on a towel, keeping your heel on the floor.  Pull the towel toward your heel by only curling your toes. Keep your heel on the floor.  If instructed by your physician, physical therapist or athletic trainer, add ____________________ at the end of the towel. Repeat __________ times. Complete this exercise __________ times per day. STRENGTH - Ankle Inversion   Secure one end of a rubber exercise band/tubing to a fixed object (table, pole). Loop the other end around your foot just  before your toes.  Place your fists between your knees. This will focus your strengthening at your ankle.  Slowly, pull your big toe up and in, making sure the band/tubing is positioned to resist the entire motion.  Hold this position for __________ seconds.  Have your muscles resist the band/tubing as it slowly pulls your foot back to the starting position. Repeat __________ times. Complete this exercise __________ times per day.  Document Released: 02/12/2005 Document Revised: 06/29/2013 Document Reviewed: 05/27/2008 [facility] Patient Information 2015 [place], [place]. This information is not intended to replace advice given to you by your health care provider. Make sure you discuss any questions you have with your health care provider.

## 2014-08-09 NOTE — Progress Notes (Signed)
Jack Stuart [address] [place], [place] Phone: [phone] Subjective:    I'm seeing this patient by the request  of:  Jack Edge, Jack Stuart   CC:  Left ankle pain  Jack Stuart is a 49 y.o. male coming in with complaint of  leftankle pain. Patient statesback in March she unfortunate fell down the stairs. Patient was bilateral heel pain now seems to be more isolated to the right heel and ankle.patient was able to get back to his regular workout routine and unfortunately injured his left ankle.patient has been doing CrossFit on a regular basis. Patient notes the pain after going a lot running with a 30 pound ball. Patient states it's more on the medial aspect the ankle. States that he had significant soreness for quite some time and took a week off. Patient states since a week off he has been doing somewhat better but he tries to increase his activity he has some mild discomfort in that area. Nothing that stops him from activity but he notices that the pain seems to be coming more and more localized. Patient has tried some icing as well as over-the-counter anti-inflammatory's which has been helpful. Not taking anything a regular basis. Rates the severity of pain a 4 out of 10 at this time.  Past Medical History  Diagnosis Date  . Hyperlipidemia   . Preventative health care 03/26/2012  . Abdominal wall hernia 03/26/2012  . GERD (gastroesophageal reflux disease)   . Conjunctivitis 04/01/2013   Past Surgical History  Procedure Laterality Date  . Cholecystectomy  2010  . Hernia repair      as a kid  . Wisdom tooth extraction    . Epigastric hernia repair N/A 04/30/2012    Procedure: HERNIA REPAIR EPIGASTRIC ADULT;  Surgeon: Clovis Pu. Cornett, Jack Stuart;  Location: [facility];  Service: General;  Laterality: N/A;  . Insertion of mesh N/A 04/30/2012    Procedure: INSERTION OF MESH;  Surgeon: Clovis Pu. Cornett, Jack Stuart;  Location: [facility];  Service: General;   Laterality: N/A;   History  Substance Use Topics  . Smoking status: Never Smoker   . Smokeless tobacco: Never Used  . Alcohol Use: Yes     Comment: socially   No Known Allergies Family History  Problem Relation Age of Onset  . Heart disease Mother   . Diabetes Mother     type 2  . Glaucoma Mother   . Heart disease Father 53    open heart surgery  . Hyperlipidemia Father   . Heart disease Maternal Grandmother   . Heart disease Paternal Grandmother   . Heart disease Paternal Grandfather   . Hyperlipidemia Sister   . Hyperlipidemia Sister   . Heart disease Paternal Uncle        Past medical history, social, surgical and family history all reviewed in electronic medical record.   Review of Systems: No headache, visual changes, nausea, vomiting, diarrhea, constipation, dizziness, abdominal pain, skin rash, fevers, chills, night sweats, weight loss, swollen lymph nodes, body aches, joint swelling, muscle aches, chest pain, shortness of breath, mood changes.   Objective Blood pressure 110/74, pulse 78, height  (1.88 m), weight 200 lb (90.719 kg), SpO2 97 %.  General: No apparent distress alert and oriented x3 mood and affect normal, dressed appropriately.  HEENT: Pupils equal, extraocular movements intact  Respiratory: Patient's speak in full sentences and does not appear short of breath  Cardiovascular: No lower extremity edema, non  tender, no erythema  Skin: Warm dry intact with no signs of infection or rash on extremities or on axial skeleton.  Abdomen: Soft nontender  Neuro: Cranial nerves II through XII are intact, neurovascularly intact in all extremities with 2+ DTRs and 2+ pulses.  Lymph: No lymphadenopathy of posterior or anterior cervical chain or axillae bilaterally.  Gait normal with good balance and coordination.  MSK:  Non tender with full range of motion and good stability and symmetric strength and tone of shoulders, elbows, wrist, hip, knees bilaterally.    Ankle:Left No visible erythema or swelling. Range of motion is full in all directions. Strength is 5/5 in all directions. Stable lateral and medial ligaments; squeeze test and kleiger test unremarkable; Talar dome nontender; No pain at base of 5th MT; No tenderness over cuboid; Mild pain over navicular bone and posteiror tib tendon.  No tenderness on posterior aspects of lateral and medial malleolus No sign of peroneal tendon subluxations or tenderness to palpation Negative tarsal tunnel tinel's Able to walk 4 steps.  MSK US performed of: leftankle This study was ordered, performed, and interpreted by Terrilee Files D.O.  Foot/Ankle:   All structures visualized.   Talar dome unremarkable  Ankle mortise +1 effusion noted Peroneus longus and brevis tendons unremarkable on long and transverse views without sheath effusions. Posterior tibialis does have hypoechoic changes noted. No true tear appreciated. Accessory tendon noted. , flexor hallucis longus, and flexor digitorum longus tendons unremarkable on long and transverse views without sheath effusions. Achilles tendon visualized along length of tendon and unremarkable on long and transverse views without sheath effusion. Anterior Talofibular Ligament and Calcaneofibular Ligaments unremarkable and intact. Deltoid Ligament unremarkable and intact. Plantar fascia intact and without effusion, normal thickness. No increased doppler signal, cap sign, or thickening of tibial cortex. Power doppler signal normal.  IMPRESSION:  posteriortibialis tendinitis as well as ankle effusion  Procedure note 97110; 15 minutes spent for Therapeutic exercises as stated in above notes.  This included exercises focusing on stretching, strengthening, with significant focus on eccentric aspects. Exercises for the foot include:  Stretches to help lengthen the lower leg and plantar fascia areas Theraband exercises for the lower leg and ankle to help strengthen  the surrounding area- dorsiflexion, plantarflexion, inversion, eversion Massage rolling on the plantar surface of the foot with a frozen bottle, tennis ball or golf ball Towel or marble pick-ups to strengthen the plantar surface of the foot Weight bearing exercises to increase balance and overall stability  Proper technique shown and discussed handout in great detail with ATC.  All questions were discussed and answered.     Impression and Recommendations:     This case required medical decision making of moderate complexity.

## 2014-08-09 NOTE — Progress Notes (Signed)
Pre visit review using our clinic review tool, if applicable. No additional management support is needed unless otherwise documented below in the visit note. 

## 2014-08-25 ENCOUNTER — Ambulatory Visit (INDEPENDENT_AMBULATORY_CARE_PROVIDER_SITE_OTHER): Payer: 59 | Admitting: Family Medicine

## 2014-08-25 ENCOUNTER — Encounter: Payer: Self-pay | Admitting: Family Medicine

## 2014-08-25 VITALS — BP 112/72 | HR 72 | Ht 74.0 in | Wt 203.0 lb

## 2014-08-25 DIAGNOSIS — M76822 Posterior tibial tendinitis, left leg: Secondary | ICD-10-CM

## 2014-08-25 DIAGNOSIS — G5631 Lesion of radial nerve, right upper limb: Secondary | ICD-10-CM | POA: Diagnosis not present

## 2014-08-25 DIAGNOSIS — G5630 Lesion of radial nerve, unspecified upper limb: Secondary | ICD-10-CM | POA: Insufficient documentation

## 2014-08-25 NOTE — Assessment & Plan Note (Signed)
Patient given home exercises, Lateral Epicondylitis: Elbow anatomy was reviewed, and tendinopathy was explained.  Pt. given a formal rehab program. Series of concentric and eccentric exercises should be done starting with no weight, work up to 1 lb, hammer, etc.  Use counterforce strap if working or using hands.  Formal PT would be beneficial. Emphasized stretching an cross-friction massage Emphasized proper palms up lifting biomechanics to unload ECRB

## 2014-08-25 NOTE — Assessment & Plan Note (Addendum)
Improved at this time. Over the course the next several weeks patient will slowly increase his running. We discussed about the possibility of stabilization shoes or even bracing. Patient continue with the icing and home exercises. Patient will continue to monitor and follow up with me again in 3-4 weeks for further evaluation and treatment.Jack GensJeffrey current effusion of the ankle occurs again we will need further imaging.

## 2014-08-25 NOTE — Progress Notes (Signed)
Pre visit review using our clinic review tool, if applicable. No additional management support is needed unless otherwise documented below in the visit note. 

## 2014-08-25 NOTE — Progress Notes (Signed)
[facility] [address] [place], [name] [address] Phone: [phone] Subjective:    I'm seeing this patient by the request  of:  Danise Edge, MD   CC:  Left ankle pain follow-up  [id number] Ola Fawver is a 49 y.o. male coming in with complaint of  leftankle pain.  Patient was found to have more of a posterior tibialis tendinitis as well as ankle effusion.  Patient states that the ankle is feeling of 85% better. Patient has not been running. Patient has been working out and doing rowing and noticed that he is been feeling relatively well. No significant pain. Patient is not having to stop him from any activities.   She is having more of a new problem. States that his left elbow pain. States that it is more of a dull, throbbing aching sensation. Patient remember date when he was lifting weights. Patient states certain movements such as extending his wrist seems to be worse. Not stopping him from daily activities and is not waking him up at night. But the severity of pain a 5 out of 10.  Past Medical History  Diagnosis Date  . Hyperlipidemia   . Preventative health care 03/26/2012  . Abdominal wall hernia 03/26/2012  . GERD (gastroesophageal reflux disease)   . Conjunctivitis 04/01/2013   Past Surgical History  Procedure Laterality Date  . Cholecystectomy  2010  . Hernia repair      as a kid  . Wisdom tooth extraction    . Epigastric hernia repair N/A 04/30/2012    Procedure: HERNIA REPAIR EPIGASTRIC ADULT;  Surgeon: Clovis Pu. Cornett, MD;  Location: [facility];  Service: General;  Laterality: N/A;  . Insertion of mesh N/A 04/30/2012    Procedure: INSERTION OF MESH;  Surgeon: Clovis Pu. Cornett, MD;  Location: [facility];  Service: General;  Laterality: N/A;   History  Substance Use Topics  . Smoking status: Never Smoker   . Smokeless tobacco: Never Used  . Alcohol Use: Yes     Comment: socially   No Known Allergies Family History  Problem Relation  Age of Onset  . Heart disease Mother   . Diabetes Mother     type 2  . Glaucoma Mother   . Heart disease Father 84    open heart surgery  . Hyperlipidemia Father   . Heart disease Maternal Grandmother   . Heart disease Paternal Grandmother   . Heart disease Paternal Grandfather   . Hyperlipidemia Sister   . Hyperlipidemia Sister   . Heart disease Paternal Uncle        Past medical history, social, surgical and family history all reviewed in electronic medical record.   Review of Systems: No headache, visual changes, nausea, vomiting, diarrhea, constipation, dizziness, abdominal pain, skin rash, fevers, chills, night sweats, weight loss, swollen lymph nodes, body aches, joint swelling, muscle aches, chest pain, shortness of breath, mood changes.   Objective Blood pressure 112/72, pulse 72, height  (1.88 m), weight 203 lb (92.08 kg), SpO2 94 %.  General: No apparent distress alert and oriented x3 mood and affect normal, dressed appropriately.  HEENT: Pupils equal, extraocular movements intact  Respiratory: Patient's speak in full sentences and does not appear short of breath  Cardiovascular: No lower extremity edema, non tender, no erythema  Skin: Warm dry intact with no signs of infection or rash on extremities or on axial skeleton.  Abdomen: Soft nontender  Neuro: Cranial nerves II through XII are intact,  neurovascularly intact in all extremities with 2+ DTRs and 2+ pulses.  Lymph: No lymphadenopathy of posterior or anterior cervical chain or axillae bilaterally.  Gait normal with good balance and coordination.  MSK:  Non tender with full range of motion and good stability and symmetric strength and tone of shoulders,  wrist, hip, knees bilaterally.  Elbow:right Unremarkable to inspection. Range of motion full pronation, supination, flexion, extension. Strength is full to all of the above directions Stable to varus, valgus stress. Negative moving valgus stress test. Tender  over the belly of the ache extensor common tendon muscle. Right over the posterior interosseous nerve. Ulnar nerve does not sublux. Negative cubital tunnel Tinel's. contralateral elbow unremarkable  Ankle:Left No visible erythema or swelling. Range of motion is full in all directions. Strength is 5/5 in all directions. Stable lateral and medial ligaments; squeeze test and kleiger test unremarkable; Talar dome nontender; No pain at base of 5th MT; No tenderness over cuboid;  nontender over the navicular bone.  No tenderness on posterior aspects of lateral and medial malleolus No sign of peroneal tendon subluxations or tenderness to palpation Negative tarsal tunnel tinel's Able to walk 4 steps.  MSK US performed of: leftankle This study was ordered, performed, and interpreted by Terrilee FilesZach Jontae Sonier D.O.  Foot/Ankle:   All structures visualized.   Talar dome unremarkable  Ankle effusion resolved from previous exam Peroneus longus and brevis tendons unremarkable on long and transverse views without sheath effusions. Posterior tibialis  Emil hypoechoic changes noted. Significantly improved.. , flexor hallucis longus, and flexor digitorum longus tendons unremarkable on long and transverse views without sheath effusions. Achilles tendon visualized along length of tendon and unremarkable on long and transverse views without sheath effusion. Anterior Talofibular Ligament and Calcaneofibular Ligaments unremarkable and intact. Deltoid Ligament unremarkable and intact. Plantar fascia intact and without effusion, normal thickness. No increased doppler signal, cap sign, or thickening of tibial cortex. Power doppler signal normal.  IMPRESSION:   Minimal posterior tibialis tendinitis with resolved ankle effusion     Impression and Recommendations:     This case required medical decision making of moderate complexity.

## 2014-08-25 NOTE — Patient Instructions (Signed)
The ankle looks good OK to run 2 times a week for 3 weeks.  Continue the exercises 3 times a week HEEL LIFT in left foot will help  Ice is your friend Lift with thumbs up or underhand Posterior interosseous nerve entraptment.  See me in 3 weeks and if elbow not better will do injection in the nerve.

## 2014-09-22 ENCOUNTER — Ambulatory Visit (INDEPENDENT_AMBULATORY_CARE_PROVIDER_SITE_OTHER): Payer: 59 | Admitting: Family Medicine

## 2014-09-22 ENCOUNTER — Encounter: Payer: Self-pay | Admitting: Family Medicine

## 2014-09-22 VITALS — BP 128/70 | HR 78 | Ht 74.0 in | Wt 203.0 lb

## 2014-09-22 DIAGNOSIS — G5631 Lesion of radial nerve, right upper limb: Secondary | ICD-10-CM | POA: Diagnosis not present

## 2014-09-22 MED ORDER — NITROGLYCERIN 0.2 MG/HR TD PT24: MEDICATED_PATCH | TRANSDERMAL | Status: DC

## 2014-09-22 NOTE — Progress Notes (Signed)
Pre visit review using our clinic review tool, if applicable. No additional management support is needed unless otherwise documented below in the visit note. 

## 2014-09-22 NOTE — Progress Notes (Signed)
[facility] [address] [place], [name] [address] Phone: [phone] Subjective:    I'm seeing this patient by the request  of:  Danise Edge, MD   CC:  Left ankle pain follow-up right elbow follow-up  BJY:NWGNFAOZHY Jack Stuart is a 49 y.o. male coming in with complaint of  leftankle pain. Patient has been on medication for the last 3 weeks and states that the pain is no longer it better. Patient is not having any discomfort at all. No longer doing the exercises or icing. Patient is not doing much exercising over the course of 3 weeks. ificant pain. Patient is not having to stop him from any activities. Patient though states that also his right elbow Better. Patient did have some posterior interosseous nerve type symptoms previously. Patient's states though that he came back and started working out unfortunate the pain came back immediately. States that it is more of a dull aching throbbing sensation. Nothing that stopping him from activities but he does not want to get any worse.   Past Medical History  Diagnosis Date  . Hyperlipidemia   . Preventative health care 03/26/2012  . Abdominal wall hernia 03/26/2012  . GERD (gastroesophageal reflux disease)   . Conjunctivitis 04/01/2013   Past Surgical History  Procedure Laterality Date  . Cholecystectomy  2010  . Hernia repair      as a kid  . Wisdom tooth extraction    . Epigastric hernia repair N/A 04/30/2012    Procedure: HERNIA REPAIR EPIGASTRIC ADULT;  Surgeon: Clovis Pu. Cornett, MD;  Location: [facility];  Service: General;  Laterality: N/A;  . Insertion of mesh N/A 04/30/2012    Procedure: INSERTION OF MESH;  Surgeon: Clovis Pu. Cornett, MD;  Location: [facility];  Service: General;  Laterality: N/A;   History  Substance Use Topics  . Smoking status: Never Smoker   . Smokeless tobacco: Never Used  . Alcohol Use: Yes     Comment: socially   No Known Allergies Family History  Problem Relation Age of  Onset  . Heart disease Mother   . Diabetes Mother     type 2  . Glaucoma Mother   . Heart disease Father 70    open heart surgery  . Hyperlipidemia Father   . Heart disease Maternal Grandmother   . Heart disease Paternal Grandmother   . Heart disease Paternal Grandfather   . Hyperlipidemia Sister   . Hyperlipidemia Sister   . Heart disease Paternal Uncle        Past medical history, social, surgical and family history all reviewed in electronic medical record.   Review of Systems: No headache, visual changes, nausea, vomiting, diarrhea, constipation, dizziness, abdominal pain, skin rash, fevers, chills, night sweats, weight loss, swollen lymph nodes, body aches, joint swelling, muscle aches, chest pain, shortness of breath, mood changes.   Objective Blood pressure 128/70, pulse 78, height  (1.88 m), weight 203 lb (92.08 kg), SpO2 96 %.  General: No apparent distress alert and oriented x3 mood and affect normal, dressed appropriately.  HEENT: Pupils equal, extraocular movements intact  Respiratory: Patient's speak in full sentences and does not appear short of breath  Cardiovascular: No lower extremity edema, non tender, no erythema  Skin: Warm dry intact with no signs of infection or rash on extremities or on axial skeleton.  Abdomen: Soft nontender  Neuro: Cranial nerves II through XII are intact, neurovascularly intact in all extremities with 2+ DTRs and 2+  pulses.  Lymph: No lymphadenopathy of posterior or anterior cervical chain or axillae bilaterally.  Gait normal with good balance and coordination.  MSK:  Non tender with full range of motion and good stability and symmetric strength and tone of shoulders,  wrist, hip, knees bilaterally.  Elbow:right Unremarkable to inspection. Range of motion full pronation, supination, flexion, extension. Strength is full to all of the above directions Stable to varus, valgus stress. Negative moving valgus stress test. Tender over  the belly of the ache extensor common tendon muscle. Right over the posterior interosseous nerve. About the same as previous exam Ulnar nerve does not sublux. Negative cubital tunnel Tinel's. contralateral elbow unremarkable  Ankle:Left No visible erythema or swelling. Range of motion is full in all directions. Strength is 5/5 in all directions. Stable lateral and medial ligaments; squeeze test and kleiger test unremarkable; Talar dome nontender; No pain at base of 5th MT; No tenderness over cuboid;  nontender over the navicular bone.  No tenderness on posterior aspects of lateral and medial malleolus No sign of peroneal tendon subluxations or tenderness to palpation Negative tarsal tunnel tinel's Able to walk 4 steps.  Procedure  97110; 15 minutes spent for Therapeutic exercises as stated in above notes.  This included exercises focusing on stretching, strengthening, with significant focus on eccentric aspects.  Lateral Epicondylitis: Elbow anatomy was reviewed, and tendinopathy was explained.  Pt. given a formal rehab program. Series of concentric and eccentric exercises should be done starting with no weight, work up to 1 lb, hammer, etc.  Use counterforce strap if working or using hands.  Formal PT would be beneficial. Emphasized stretching an cross-friction massage Emphasized proper palms up lifting biomechanics to unload ECRB  Proper technique shown and discussed handout in great detail with ATC.  All questions were discussed and answered.      Impression and Recommendations:     This case required medical decision making of moderate complexity.

## 2014-09-22 NOTE — Patient Instructions (Addendum)
Good to see you Wrist brace at night Try to get elbows up at computer Ice 20 minutes 2 times daily. Usually after activity and before bed. Exercises 3 times a week.  Drop weight 50% and increase 25% a week Thumbs up or underhand with lifting See me again in 3 weeks and if not better do injection in the nerve.   Nitroglycerin Protocol   Apply 1/4 nitroglycerin patch to affected area daily.  Change position of patch within the affected area every 24 hours.  You may experience a headache during the first 1-2 weeks of using the patch, these should subside.  If you experience headaches after beginning nitroglycerin patch treatment, you may take your preferred over the counter pain reliever.  Another side effect of the nitroglycerin patch is skin irritation or rash related to patch adhesive.  Please notify our office if you develop more severe headaches or rash, and stop the patch.  Tendon healing with nitroglycerin patch may require 12 to 24 weeks depending on the extent of injury.  Men should not use if taking Viagra, Cialis, or Levitra.   Do not use if you have migraines or rosacea.

## 2014-09-22 NOTE — Assessment & Plan Note (Signed)
Vision does have pain in this area and does have some mild inflammation on ultrasound. I do think that this is contribute in. Patient is unaware wrist brace at night, we discussed natural history patch was patient will put in the area of most pain we discussed possible side effects. We discussed icing protocol and home exercises. Patient work with Event organiser today. Patient will come back and see me again in 3 weeks. Continuing have pain at that time we may need to consider injection.  Spent  25 minutes with patient face-to-face and had greater than 50% of counseling including as described above in assessment and plan.

## 2014-10-05 ENCOUNTER — Encounter: Payer: Self-pay | Admitting: Family Medicine

## 2014-10-05 ENCOUNTER — Ambulatory Visit (INDEPENDENT_AMBULATORY_CARE_PROVIDER_SITE_OTHER): Payer: 59 | Admitting: Family Medicine

## 2014-10-05 VITALS — BP 108/72 | HR 77 | Temp 97.8°F | Ht 74.0 in | Wt 204.0 lb

## 2014-10-05 DIAGNOSIS — E785 Hyperlipidemia, unspecified: Secondary | ICD-10-CM

## 2014-10-05 NOTE — Progress Notes (Signed)
Pre visit review using our clinic review tool, if applicable. No additional management support is needed unless otherwise documented below in the visit note. 

## 2014-10-13 ENCOUNTER — Encounter: Payer: Self-pay | Admitting: Family Medicine

## 2014-10-13 ENCOUNTER — Ambulatory Visit (INDEPENDENT_AMBULATORY_CARE_PROVIDER_SITE_OTHER): Payer: 59 | Admitting: Family Medicine

## 2014-10-13 VITALS — BP 122/72 | HR 67 | Ht 74.0 in | Wt 204.0 lb

## 2014-10-13 DIAGNOSIS — M7711 Lateral epicondylitis, right elbow: Secondary | ICD-10-CM | POA: Insufficient documentation

## 2014-10-13 HISTORY — DX: Lateral epicondylitis, right elbow: M77.11

## 2014-10-13 MED ORDER — NITROGLYCERIN 0.2 MG/HR TD PT24: MEDICATED_PATCH | TRANSDERMAL | Status: DC

## 2014-10-13 NOTE — Progress Notes (Signed)
Jack Stuart, Jack Stuart Phone: [phone] Subjective:    I CC:  Right elbow pain follow-up  BJY:NWGNFAOZHY Jack Stuart is a 49 y.o. male coming in with complaint of  right elbow pain. 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 Patient states he is doing much better until Friday and then start having worsening symptoms again. Patient states unfortunately is very sore. When he lifts certain things in certain ways. Denies any numbness. We'll not consider himself weeks ago. Patient IS NOT ABLE TO WORKOUT ON A REGULAR BASIS SECONDARY TO THE PAIN.Marland Kitchen   Past Medical History  Diagnosis Date  . Hyperlipidemia   . Preventative health care 03/26/2012  . Abdominal wall hernia 03/26/2012  . GERD (gastroesophageal reflux disease)   . Conjunctivitis 04/01/2013   Past Surgical History  Procedure Laterality Date  . Cholecystectomy  2010  . Hernia repair      as a kid  . Wisdom tooth extraction    . Epigastric hernia repair N/A 04/30/2012    Procedure: HERNIA REPAIR EPIGASTRIC ADULT;  Surgeon: Clovis Pu. Cornett, MD;  Location: [facility];  Service: General;  Laterality: N/A;  . Insertion of mesh N/A 04/30/2012    Procedure: INSERTION OF MESH;  Surgeon: Clovis Pu. Cornett, MD;  Location: [facility];  Service: General;  Laterality: N/A;   Social History  Substance Use Topics  . Smoking status: Never Smoker   . Smokeless tobacco: Never Used  . Alcohol Use: Yes     Comment: socially   No Known Allergies Family History  Problem Relation Age of Onset  . Heart disease Mother   . Diabetes Mother     type 2  . Glaucoma Mother   . Heart disease Father 82    open heart surgery  . Hyperlipidemia Father   . Heart disease Maternal Grandmother   . Heart disease Paternal Grandmother   . Heart disease Paternal Grandfather   . Hyperlipidemia Sister   . Hyperlipidemia Sister   .  Heart disease Paternal Uncle        Past medical history, social, surgical and family history all reviewed in electronic medical record.   Review of Systems: No headache, visual changes, nausea, vomiting, diarrhea, constipation, dizziness, abdominal pain, skin rash, fevers, chills, night sweats, weight loss, swollen lymph nodes, body aches, joint swelling, muscle aches, chest pain, shortness of breath, mood changes.   Objective Blood pressure 122/72, pulse 67, height 6\' 2"  (1.88 m), weight 204 lb (92.534 kg), SpO2 96 %.  General: No apparent distress alert and oriented x3 mood and affect normal, dressed appropriately.  HEENT: Pupils equal, extraocular movements intact  Respiratory: Patient's speak in full sentences and does not appear short of breath  Cardiovascular: No lower extremity edema, non tender, no erythema  Skin: Warm dry intact with no signs of infection or rash on extremities or on axial skeleton.  Abdomen: Soft nontender  Neuro: Cranial nerves II through XII are intact, neurovascularly intact in all extremities with 2+ DTRs and 2+ pulses.  Lymph: No lymphadenopathy of posterior or anterior cervical chain or axillae bilaterally.  Gait normal with good balance and coordination.  MSK:  Non tender with full range of motion and good stability and symmetric strength and tone of shoulders,  wrist, hip, knees bilaterally.  Elbow:right Unremarkable to inspection. Range of motion full pronation, supination, flexion, extension. Strength is full to all  of the above directions Stable to varus, valgus stress. Negative moving valgus stress test. Or tender over the insertion of the common extensor tendon as well as somewhat over the distal brachial radialis Ulnar nerve does not sublux. Negative cubital tunnel Tinel's. contralateral elbow unremarkable  Musculoskeletal ultrasound was performed and interpreted by Terrilee Files D.O.   Elbow:  Lateral epicondyle and common extensor tendon  origin visualized significant hypoechoic changes and scar tissue formation noted near new full-thickness tear. Significant increase in Doppler flow noted as well. Still some enlargement of the posterior interosseous nerve but not as much as previous exam Radial head unremarkable and located in annular ligament Medial epicondyle and common flexor tendon origin visualized.  No edema, effusions, or avulsions seen. Ulnar nerve in cubital tunnel unremarkable. Olecranon and triceps insertion visualized and unremarkable without edema, effusion, or avulsion.  No signs olecranon bursitis. Power doppler signal normal.  IMPRESSION: Acute tear seems to be interval healing of the common extensor tendon. Questionable brachial radialis injury as well.   .      Impression and Recommendations:     This case required medical decision making of moderate complexity.

## 2014-10-13 NOTE — Patient Instructions (Signed)
Good to see you Ice is your friend Back in the brace day and night for 1 week then nightly for another 2 weeks.  Nitroglycerin Protocol   Apply 1/4 nitroglycerin patch to affected area daily.  Change position of patch within the affected area every 24 hours.  You may experience a headache during the first 1-2 weeks of using the patch, these should subside.  If you experience headaches after beginning nitroglycerin patch treatment, you may take your preferred over the counter pain reliever.  Another side effect of the nitroglycerin patch is skin irritation or rash related to patch adhesive.  Please notify our office if you develop more severe headaches or rash, and stop the patch.  Tendon healing with nitroglycerin patch may require 12 to 24 weeks depending on the extent of injury.  Men should not use if taking Viagra, Cialis, or Levitra.   Do not use if you have migraines or rosacea.  Physical therapy will be calling you  See me again in 3 weeks. And we will discuss toe and possible injectio nif need in the elbow.

## 2014-10-13 NOTE — Progress Notes (Signed)
Pre visit review using our clinic review tool, if applicable. No additional management support is needed unless otherwise documented below in the visit note. 

## 2014-10-13 NOTE — Assessment & Plan Note (Signed)
Patient is more of a lateral epicondylitis noted. I think this is likely given him more than concern. It appears that this is fairly new tear since she was last exam. We discussed with patient about physical therapy, nitroglycerin patches and warned of potential side effects. We discussed icing regimen. Patient will try all these interventions and come back and see me in 3 weeks. At that time if continuing have pain we may need to consider injection. Possible advance imaging.

## 2014-10-17 NOTE — Progress Notes (Signed)
Patient had to leave without being seen.  

## 2014-11-12 ENCOUNTER — Ambulatory Visit: Payer: 59 | Admitting: Family Medicine

## 2014-11-30 ENCOUNTER — Ambulatory Visit: Payer: 59 | Admitting: Family Medicine

## 2014-12-18 IMAGING — CR DG CERVICAL SPINE COMPLETE 4+V
6 series · 6 of 6 positions shown · non-contrast
Comparison: None.

CLINICAL DATA: Neck pain.

EXAM:
CERVICAL SPINE  4+ VIEWS

[w c-spine lat]
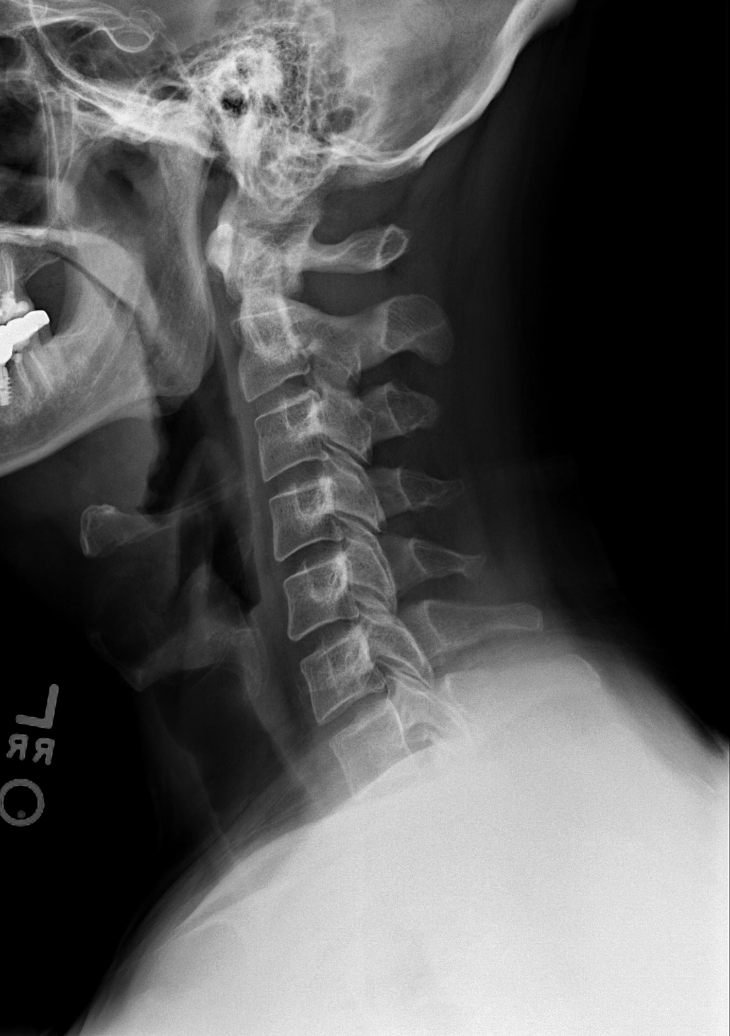

[w c-spine oblique (1 of 2)]
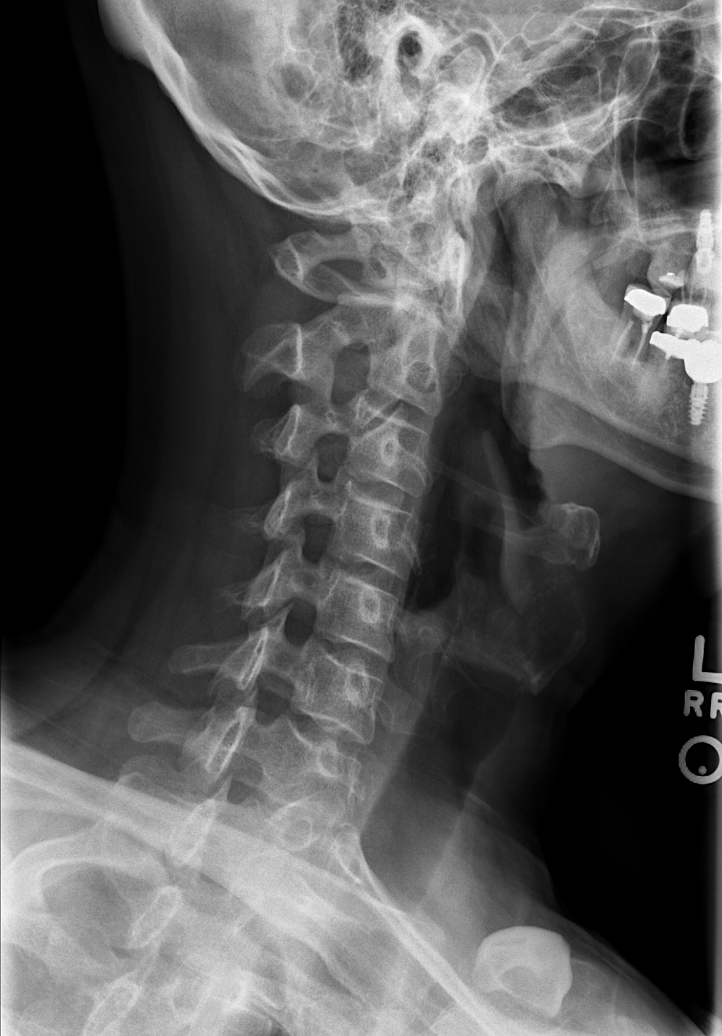

[w c-spine oblique (2 of 2)]
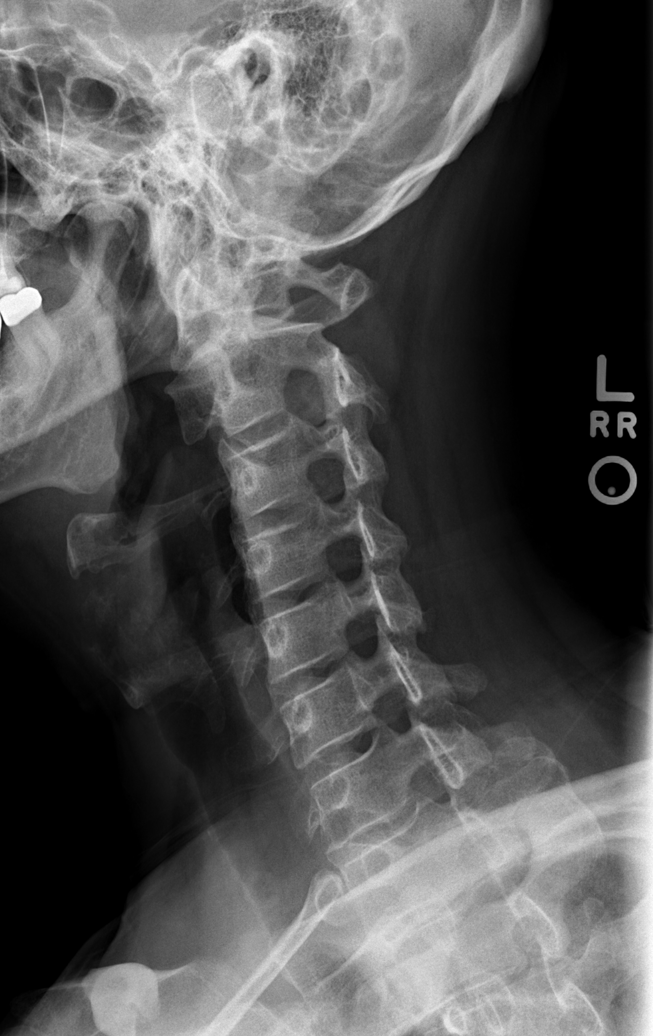

[w c-spine a.p.]
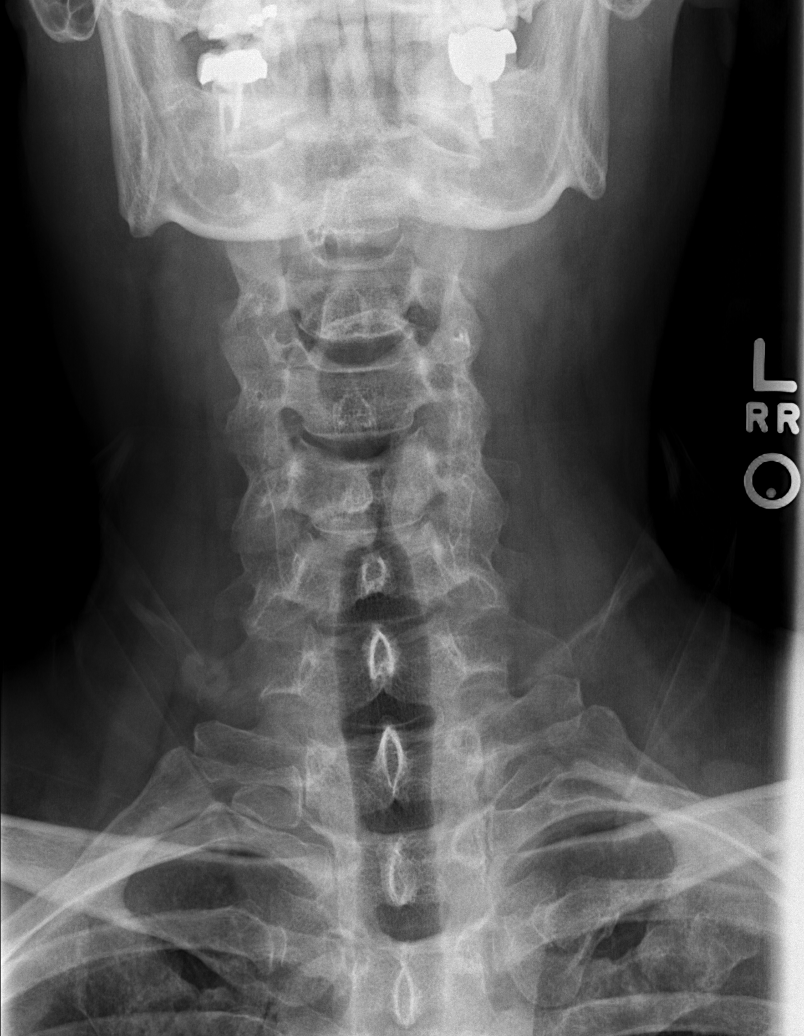

[w c-spine odontoid]
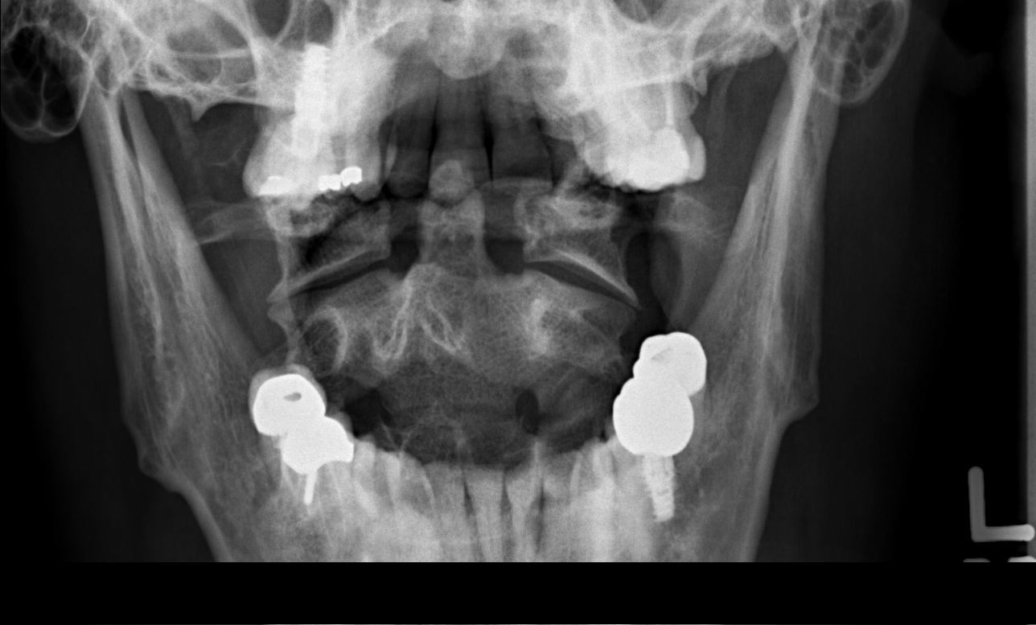

[w swimmers view]
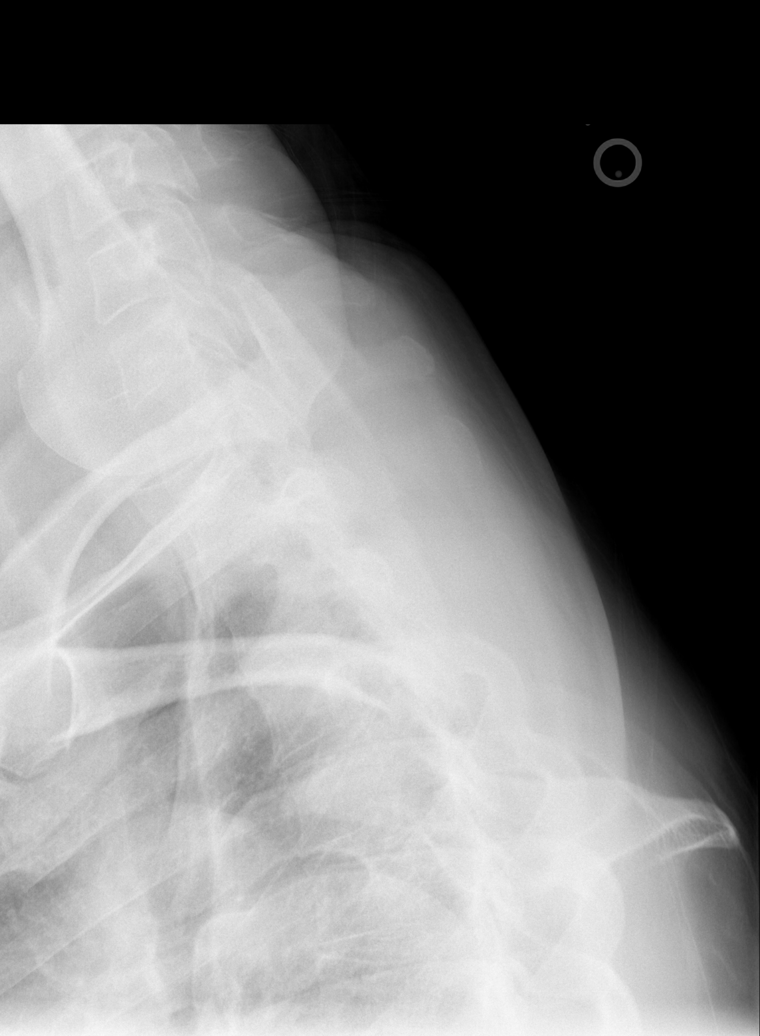

[6 of 6 positions shown; findings below may reference images not displayed]

FINDINGS: Minimal reversal of normal lordosis of upper cervical spine is noted
which most likely is positional in origin. No fracture or
spondylolisthesis is noted. Disc spaces are well-maintained. No
significant neural foraminal stenosis is noted. Posterior facet
joints appear normal.
IMPRESSION: No significant abnormality seen in the cervical spine.

## 2015-01-17 ENCOUNTER — Encounter (HOSPITAL_BASED_OUTPATIENT_CLINIC_OR_DEPARTMENT_OTHER): Payer: Self-pay | Admitting: Emergency Medicine

## 2015-01-17 ENCOUNTER — Emergency Department (HOSPITAL_BASED_OUTPATIENT_CLINIC_OR_DEPARTMENT_OTHER)
Admission: EM | Admit: 2015-01-17 | Discharge: 2015-01-17 | Disposition: A | Discharge status: Home / Self Care | Payer: 59 | Attending: Emergency Medicine | Admitting: Emergency Medicine

## 2015-01-17 DIAGNOSIS — H6691 Otitis media, unspecified, right ear: Secondary | ICD-10-CM | POA: Diagnosis not present

## 2015-01-17 DIAGNOSIS — E785 Hyperlipidemia, unspecified: Secondary | ICD-10-CM | POA: Diagnosis not present

## 2015-01-17 DIAGNOSIS — Z79899 Other long term (current) drug therapy: Secondary | ICD-10-CM | POA: Insufficient documentation

## 2015-01-17 DIAGNOSIS — K219 Gastro-esophageal reflux disease without esophagitis: Secondary | ICD-10-CM | POA: Diagnosis not present

## 2015-01-17 DIAGNOSIS — H9313 Tinnitus, bilateral: Secondary | ICD-10-CM | POA: Diagnosis present

## 2015-01-17 MED ORDER — CEFDINIR 300 MG PO CAPS: 300.0000 mg | ORAL_CAPSULE | Freq: Two times a day (BID) | ORAL | Status: DC

## 2015-01-17 NOTE — Discharge Instructions (Signed)
Read the information below.  Use the prescribed medication as directed.  Please discuss all new medications with your pharmacist.  You may return to the Emergency Department at any time for worsening condition or any new symptoms that concern you.   If you develop fevers, uncontrolled ear pain, bleeding or discharge from your ear, see your doctor or return for a recheck.      Otitis Media, Adult Otitis media is redness, soreness, and inflammation of the middle ear. Otitis media may be caused by allergies or, most commonly, by infection. Often it occurs as a complication of the common cold. SIGNS AND SYMPTOMS Symptoms of otitis media may include:  Earache.  Fever.  Ringing in your ear.  Headache.  Leakage of fluid from the ear. DIAGNOSIS To diagnose otitis media, your health care provider will examine your ear with an otoscope. This is an instrument that allows your health care provider to see into your ear in order to examine your eardrum. Your health care provider also will ask you questions about your symptoms. TREATMENT  Typically, otitis media resolves on its own within 3-5 days. Your health care provider may prescribe medicine to ease your symptoms of pain. If otitis media does not resolve within 5 days or is recurrent, your health care provider may prescribe antibiotic medicines if he or she suspects that a bacterial infection is the cause. HOME CARE INSTRUCTIONS   If you were prescribed an antibiotic medicine, finish it all even if you start to feel better.  Take medicines only as directed by your health care provider.  Keep all follow-up visits as directed by your health care provider. SEEK MEDICAL CARE IF:  You have otitis media only in one ear, or bleeding from your nose, or both.  You notice a lump on your neck.  You are not getting better in 3-5 days.  You feel worse instead of better. SEEK IMMEDIATE MEDICAL CARE IF:   You have pain that is not controlled with  medicine.  You have swelling, redness, or pain around your ear or stiffness in your neck.  You notice that part of your face is paralyzed.  You notice that the bone behind your ear (mastoid) is tender when you touch it. MAKE SURE YOU:   Understand these instructions.  Will watch your condition.  Will get help right away if you are not doing well or get worse.   This information is not intended to replace advice given to you by your health care provider. Make sure you discuss any questions you have with your health care provider.   Document Released: 11/18/2003 Document Revised: 03/05/2014 Document Reviewed: 09/09/2012 Elsevier Interactive Patient Education [employer]2016 [employer] Inc[employer]

## 2015-01-17 NOTE — ED Notes (Signed)
Patient states that he is unable to hear out of his right ear. The patient reports that for the last 2 weeks he gets a "wooshing sound"

## 2015-01-17 NOTE — ED Provider Notes (Signed)
CSN: [id number]     Arrival date & time 01/17/15  2222 History   First MD Initiated Contact with Patient 01/17/15 2317     Chief Complaint  Patient presents with  . Hearing Loss     (Consider location/radiation/quality/duration/timing/severity/associated sxs/prior Treatment) The history is provided by the patient.  Jack Stuart states he has been hearing a wooshing in his right ear, mostly at night, for the past few weeks.  States at baseline he has tinnitus in both ears that is worse at night. Today he had sudden loss of hearing in the right ear.  Has had sinus congestion for several months.  Otherwise denies fevers, URI symptoms.  No hx vertigo.   Past Medical History  Diagnosis Date  . Hyperlipidemia   . Preventative health care 03/26/2012  . Abdominal wall hernia 03/26/2012  . GERD (gastroesophageal reflux disease)   . Conjunctivitis 04/01/2013   Past Surgical History  Procedure Laterality Date  . Cholecystectomy  2010  . Hernia repair      as a kid  . Wisdom tooth extraction    . Epigastric hernia repair N/A 04/30/2012    Procedure: HERNIA REPAIR EPIGASTRIC ADULT;  Surgeon: Clovis Puhomas A. Cornett, MD;  Location: [facility];  Service: General;  Laterality: N/A;  . Insertion of mesh N/A 04/30/2012    Procedure: INSERTION OF MESH;  Surgeon: Clovis Puhomas A. Cornett, MD;  Location: [facility];  Service: General;  Laterality: N/A;   Family History  Problem Relation Age of Onset  . Heart disease Mother   . Diabetes Mother     type 2  . Glaucoma Mother   . Heart disease Father 1355    open heart surgery  . Hyperlipidemia Father   . Heart disease Maternal Grandmother   . Heart disease Paternal Grandmother   . Heart disease Paternal Grandfather   . Hyperlipidemia Sister   . Hyperlipidemia Sister   . Heart disease Paternal Uncle    Social History  Substance Use Topics  . Smoking status: Never Smoker   . Smokeless tobacco: Never Used  . Alcohol Use: Yes     Comment: socially    Review of Systems   Constitutional: Negative for fever and chills.  HENT: Positive for congestion. Negative for ear discharge, facial swelling, sore throat and trouble swallowing.   Musculoskeletal: Negative for myalgias and neck stiffness.  Skin: Negative for rash.  Allergic/Immunologic: Negative for immunocompromised state.  Psychiatric/Behavioral: Negative for self-injury.      Allergies  Review of patient's allergies indicates no known allergies.  Home Medications   Prior to Admission medications   Medication Sig Start Date End Date Taking? Authorizing Provider  cholestyramine Lanetta Inch(QUESTRAN) 4 G packet Take 1 packet by mouth 2 (two) times daily. 01/29/14   Hilarie FredricksonJohn N Perry, MD  Coenzyme Q10 (CO Q 10 PO) Take 1 tablet by mouth daily.     Historical Provider, MD  KRILL OIL PO Take 1 tablet by mouth daily.    Historical Provider, MD  Multiple Vitamin (MULTIVITAMIN) tablet Take 1 tablet by mouth daily.    Historical Provider, MD  nitroGLYCERIN (NITRODUR - DOSED IN MG/24 HR) 0.2 mg/hr patch 1/4 patch daily 10/13/14   Judi SaaZachary M Smith, DO  omeprazole (PRILOSEC) 20 MG capsule Take 20 mg by mouth daily.    Historical Provider, MD  vitamin C (ASCORBIC ACID) 500 MG tablet Take 500 mg by mouth daily.    Historical Provider, MD   BP 150/95 mmHg  Pulse 78  Temp(Src) 98.3  F (36.8 C) (Oral)  Resp 20  Ht  (1.88 m)  Wt 92.534 kg  BMI 26.18 kg/m2  SpO2 98% Physical Exam  Constitutional: He appears well-developed and well-nourished. No distress.  HENT:  Head: Normocephalic and atraumatic.  Right Ear: Ear canal normal.  Left Ear: Tympanic membrane and ear canal normal.  Mouth/Throat: Oropharynx is clear and moist. No oropharyngeal exudate.  Right TM with abnormal-appearing murky yellow fluid behind it.  No e/o perforation.    Eyes: Conjunctivae and EOM are normal.  Neck: Normal range of motion. Neck supple.  Pulmonary/Chest: Effort normal.  Neurological: He is alert.  Skin: He is not diaphoretic.  Nursing  note and vitals reviewed.   ED Course  Procedures (including critical care time) Labs Review Labs Reviewed - No data to display  Imaging Review No results found. I have personally reviewed and evaluated these images and lab results as part of my medical decision-making.   EKG Interpretation None      MDM   Final diagnoses:  Acute right otitis media, recurrence not specified, unspecified otitis media type    Afebrile, nontoxic patient with change in hearing in right ear.  Right TM is abnormal.  No URI symptoms to suggest viral cause.  C/W OM.   D/C home with cefdinir, PCP follow up. (Jack Stuart unsure, thinks he may have developed a rash with an antibiotic in the past and thinks it may have been amoxicillin.  No hx anaphylaxis or any symptoms worse than mild rash)  Discussed result, findings, treatment, and follow up  with patient.  Jack Stuart given return precautions.  Jack Stuart verbalizes understanding and agrees with plan.         Trixie Dredge, PA-C 01/18/15 0108  Paula Libra, MD 01/18/15 [phone]

## 2015-02-16 ENCOUNTER — Other Ambulatory Visit: Payer: Self-pay | Admitting: Internal Medicine

## 2015-05-17 ENCOUNTER — Encounter: Payer: Self-pay | Admitting: Family Medicine

## 2015-05-17 ENCOUNTER — Other Ambulatory Visit (INDEPENDENT_AMBULATORY_CARE_PROVIDER_SITE_OTHER): Payer: 59

## 2015-05-17 ENCOUNTER — Ambulatory Visit (INDEPENDENT_AMBULATORY_CARE_PROVIDER_SITE_OTHER): Payer: 59 | Admitting: Family Medicine

## 2015-05-17 VITALS — BP 118/80 | HR 76 | Wt 208.0 lb

## 2015-05-17 DIAGNOSIS — M7711 Lateral epicondylitis, right elbow: Secondary | ICD-10-CM

## 2015-05-17 MED ORDER — VITAMIN D (ERGOCALCIFEROL) 1.25 MG (50000 UNIT) PO CAPS: 50000.0000 [IU] | ORAL_CAPSULE | ORAL | Status: DC

## 2015-05-17 NOTE — Patient Instructions (Signed)
Good to see you  Jack Stuart is your friend Start cross fit only 2-3 times a week Keep thumbs up! When lifting Start nitro 1/4 patch again.  Bromelain 2500mg  with each meal can help absorption and weight loss See me again in 6 weeks.

## 2015-05-17 NOTE — Assessment & Plan Note (Signed)
Patient will has a tear that has improved since previous exam. I do not think any type of injection with the help. Encourage him to start doing the nitroglycerin again. Patient will start once weekly vitamin D supplementation. We discussed over-the-counter medications a can help with absorption as well as inflammation. Patient and will come back and see me again in 4 weeks. If having worsening symptoms we'll consider injection.  Spent  25 minutes with patient face-to-face and had greater than 50% of counseling including as described above in assessment and plan.

## 2015-05-17 NOTE — Progress Notes (Signed)
[facility] [address] [place], [name] [address] Phone: [phone] Subjective:    I CC:  Right elbow pain follow-up  [id number] Jack Stuart is a 50 y.o. male coming in with complaint of  right elbow pain.  Found to have a near full-thickness tear of the common extensor tendon. Has made some progress. Patient states that he can do daily activities without any significant discomfort. Patient states that he has been trying to make some improvement overall. Patient states that still some discomfort when reaching for something or heavy lifting. Not going to the gym on a regular basis though. Patient wants to be able to do more activities.   Past Medical History  Diagnosis Date  . Hyperlipidemia   . Preventative health care 03/26/2012  . Abdominal wall hernia 03/26/2012  . GERD (gastroesophageal reflux disease)   . Conjunctivitis 04/01/2013   Past Surgical History  Procedure Laterality Date  . Cholecystectomy  2010  . Hernia repair      as a kid  . Wisdom tooth extraction    . Epigastric hernia repair N/A 04/30/2012    Procedure: HERNIA REPAIR EPIGASTRIC ADULT;  Surgeon: Clovis Pu. Cornett, MD;  Location: [facility];  Service: General;  Laterality: N/A;  . Insertion of mesh N/A 04/30/2012    Procedure: INSERTION OF MESH;  Surgeon: Clovis Pu. Cornett, MD;  Location: [facility];  Service: General;  Laterality: N/A;   Social History  Substance Use Topics  . Smoking status: Never Smoker   . Smokeless tobacco: Never Used  . Alcohol Use: Yes     Comment: socially   No Known Allergies Family History  Problem Relation Age of Onset  . Heart disease Mother   . Diabetes Mother     type 2  . Glaucoma Mother   . Heart disease Father 36    open heart surgery  . Hyperlipidemia Father   . Heart disease Maternal Grandmother   . Heart disease Paternal Grandmother   . Heart disease Paternal Grandfather   . Hyperlipidemia Sister   . Hyperlipidemia Sister   . Heart  disease Paternal Uncle        Past medical history, social, surgical and family history all reviewed in electronic medical record.   Review of Systems: No headache, visual changes, nausea, vomiting, diarrhea, constipation, dizziness, abdominal pain, skin rash, fevers, chills, night sweats, weight loss, swollen lymph nodes, body aches, joint swelling, muscle aches, chest pain, shortness of breath, mood changes.   Objective Blood pressure 118/80, pulse 76, weight 208 lb (94.348 kg).  General: No apparent distress alert and oriented x3 mood and affect normal, dressed appropriately.  HEENT: Pupils equal, extraocular movements intact  Respiratory: Patient's speak in full sentences and does not appear short of breath  Cardiovascular: No lower extremity edema, non tender, no erythema  Skin: Warm dry intact with no signs of infection or rash on extremities or on axial skeleton.  Abdomen: Soft nontender  Neuro: Cranial nerves II through XII are intact, neurovascularly intact in all extremities with 2+ DTRs and 2+ pulses.  Lymph: No lymphadenopathy of posterior or anterior cervical chain or axillae bilaterally.  Gait normal with good balance and coordination.  MSK:  Non tender with full range of motion and good stability and symmetric strength and tone of shoulders,  wrist, hip, knees bilaterally.  Elbow:right Unremarkable to inspection. Range of motion full pronation, supination, flexion, extension. Strength is full to all of the above directions Stable to  varus, valgus stress. Negative moving valgus stress test. Still mildly tender over the insertion of the common extensor tendon  Ulnar nerve does not sublux. Negative cubital tunnel Tinel's. contralateral elbow unremarkable  Musculoskeletal ultrasound was performed and interpreted by Terrilee FilesZach Todd Argabright D.O.   Elbow:  Significant improvement from the near full-thickness tear that was seen previously. Still approximately 25-30% tear noted Significant  increase in Doppler flow noted as well. Still some enlargement of the posterior interosseous nerve but not as much as previous exam Radial head unremarkable and located in annular ligament Medial epicondyle and common flexor tendon origin visualized.  No edema, effusions, or avulsions seen. Ulnar nerve in cubital tunnel unremarkable. Olecranon and triceps insertion visualized and unremarkable without edema, effusion, or avulsion.  No signs olecranon bursitis. Power doppler signal normal. Picture saved and internal or drive IMPRESSION: continued small tear of the extensor common tendon origin.   .      Impression and Recommendations:     This case required medical decision making of moderate complexity.

## 2015-07-08 ENCOUNTER — Ambulatory Visit (INDEPENDENT_AMBULATORY_CARE_PROVIDER_SITE_OTHER): Payer: 59 | Admitting: Physician Assistant

## 2015-07-08 ENCOUNTER — Encounter: Payer: Self-pay | Admitting: Physician Assistant

## 2015-07-08 VITALS — BP 110/62 | HR 78 | Temp 98.1°F | Resp 16 | Ht 74.0 in | Wt 210.1 lb

## 2015-07-08 DIAGNOSIS — B360 Pityriasis versicolor: Secondary | ICD-10-CM | POA: Diagnosis not present

## 2015-07-08 MED ORDER — METHYLPREDNISOLONE ACETATE 40 MG/ML IJ SUSP
40.0000 mg | Freq: Once | INTRAMUSCULAR | Status: AC
  Administered 2015-07-08: 40 mg via INTRAMUSCULAR

## 2015-07-08 MED ORDER — ITRACONAZOLE 200 MG PO TABS: 200.0000 mg | ORAL_TABLET | Freq: Every day | ORAL | Status: DC

## 2015-07-08 MED ORDER — HYDROXYZINE HCL 10 MG PO TABS: 10.0000 mg | ORAL_TABLET | Freq: Three times a day (TID) | ORAL | Status: DC | PRN

## 2015-07-08 NOTE — Progress Notes (Signed)
Pre visit review using our clinic review tool, if applicable. No additional management support is needed unless otherwise documented below in the visit note/SLS  

## 2015-07-08 NOTE — Progress Notes (Signed)
Patient presents to clinic today c/o pruritic rash of back and chest x 3 weeks. Denies fevers, chills, joint aches. Denies recent travel or sick contact with similar rash. Patient endorses doing yard work daily but is not sure of exposure to any plants. Denies change to soaps/lotions, detergents or other hygiene products. Patient endorses going to UC 1.5 weeks ago and diagnosed as psoriasis. Was started prednisone taper. Endorses completing as directed with resolution of rash. Patient endorses rash recurred on the back since yesterday. (completed prednisone 3 days ago). Rash is worse with heat, relieved with cool compresses. Denies history of psoriasis. Does endorses history of tinea versicolor.   Past Medical History  Diagnosis Date  . Hyperlipidemia   . Preventative health care 03/26/2012  . Abdominal wall hernia 03/26/2012  . GERD (gastroesophageal reflux disease)   . Conjunctivitis 04/01/2013    Current Outpatient Prescriptions on File Prior to Visit  Medication Sig Dispense Refill  . cholestyramine (QUESTRAN) 4 G packet TAKE 1 PACKET(MIXED AS DIRECTED) BY MOUTH 2 (TWO) TIMES DAILY. (Patient taking differently: TAKE 1 PACKET(MIXED AS DIRECTED) BY MOUTH ONCE DAILY) 180 packet 1  . Coenzyme Q10 (CO Q 10 PO) Take 1 tablet by mouth daily.     Marland Kitchen. KRILL OIL PO Take 1 tablet by mouth daily.    . Multiple Vitamin (MULTIVITAMIN) tablet Take 1 tablet by mouth daily.    Marland Kitchen. omeprazole (PRILOSEC) 20 MG capsule Take 20 mg by mouth daily.    . vitamin C (ASCORBIC ACID) 500 MG tablet Take 500 mg by mouth daily.    . Vitamin D, Ergocalciferol, (DRISDOL) 50000 units CAPS capsule Take 1 capsule (50,000 Units total) by mouth every 7 (seven) days. 8 capsule 0  . nitroGLYCERIN (NITRODUR - DOSED IN MG/24 HR) 0.2 mg/hr patch 1/4 patch daily (Patient not taking: Reported on 07/08/2015) 30 patch 1   No current facility-administered medications on file prior to visit.    No Known Allergies  Family History  Problem  Relation Age of Onset  . Heart disease Mother   . Diabetes Mother     type 2  . Glaucoma Mother   . Heart disease Father 4055    open heart surgery  . Hyperlipidemia Father   . Heart disease Maternal Grandmother   . Heart disease Paternal Grandmother   . Heart disease Paternal Grandfather   . Hyperlipidemia Sister   . Hyperlipidemia Sister   . Heart disease Paternal Uncle     Social History   Social History  . Marital Status: Married    Spouse Name: N/A  . Number of Children: 2  . Years of Education: N/A   Occupational History  . VP Marketing    Social History Main Topics  . Smoking status: Never Smoker   . Smokeless tobacco: Never Used  . Alcohol Use: Yes     Comment: socially  . Drug Use: No  . Sexual Activity: Yes   Other Topics Concern  . None   Social History Narrative   Review of Systems - See HPI.  All other ROS are negative.  BP 110/62 mmHg  Pulse 78  Temp(Src) 98.1 F (36.7 C) (Oral)  Resp 16  Ht 6\' 2"  (1.88 m)  Wt 210 lb 2 oz (95.312 kg)  BMI 26.97 kg/m2  SpO2 98%  Physical Exam  Constitutional: He is oriented to person, place, and time and well-developed, well-nourished, and in no distress.  HENT:  Head: Normocephalic and atraumatic.  Cardiovascular: Normal rate,  regular rhythm, normal heart sounds and intact distal pulses.   Pulmonary/Chest: Effort normal and breath sounds normal. No respiratory distress. He has no wheezes. He has no rales. He exhibits no tenderness.  Neurological: He is alert and oriented to person, place, and time.  Skin: Skin is warm and dry.     Psychiatric: Affect normal.  Vitals reviewed.  Assessment/Plan: 1. Tinea versicolor Rx itraconazole for 5 days. Supportive measures reviewed. Depomedrol given. - Itraconazole 200 MG TABS; Take 200 mg by mouth daily.  Dispense: 5 tablet; Refill: 0

## 2015-07-08 NOTE — Addendum Note (Signed)
Addended by: Regis BillSCATES, SHARON L on: 07/08/2015 05:34 PM   Modules accepted: Orders

## 2015-07-08 NOTE — Patient Instructions (Signed)
Please take the oral antifungal as directed. Keep skin clean and dry. Avoid heat when possible as it worsens rash. Use the Hydroxyzine as directed for itch. Cold compresses and Sarna lotion may also be beneficial.  Follow-up if symptoms are not resolving.

## 2015-07-16 ENCOUNTER — Other Ambulatory Visit: Payer: Self-pay | Admitting: Family Medicine

## 2015-07-18 NOTE — Telephone Encounter (Signed)
Pt has completed course of treatment. Refill denied.  

## 2015-09-18 ENCOUNTER — Other Ambulatory Visit: Payer: Self-pay | Admitting: Internal Medicine

## 2015-10-24 ENCOUNTER — Telehealth: Payer: Self-pay | Admitting: Family Medicine

## 2015-10-24 DIAGNOSIS — B360 Pityriasis versicolor: Secondary | ICD-10-CM

## 2015-10-24 MED ORDER — ITRACONAZOLE 200 MG PO TABS: 200.0000 mg | ORAL_TABLET | Freq: Every day | ORAL | 0 refills | Status: DC

## 2015-10-24 MED ORDER — HYDROXYZINE HCL 10 MG PO TABS: 10.0000 mg | ORAL_TABLET | Freq: Three times a day (TID) | ORAL | 0 refills | Status: DC | PRN

## 2015-10-24 NOTE — Telephone Encounter (Signed)
I have refilled the antifungal cream.  This would be the only medication I sent in for the rash. He did have an Rx for hydroxyzine for itch. I will send in refill. If symptoms are not resolving, he needs assessment in office.

## 2015-10-24 NOTE — Telephone Encounter (Signed)
Caller name: Onalee HuaDAvid Relationship to patient: sent Can be reached: [phone] Pharmacy: Located in: [facility]  [address], [place], [place] [address]  Phone: [phone]   Reason for call: pt is working out of town all week and the rash he had in May has come back primarily on his back. He is having a great deal of itching. Most of the same sx he had previously. He is requesting med refills on both meds from 07/08/15.

## 2015-11-11 ENCOUNTER — Telehealth: Payer: Self-pay | Admitting: Family Medicine

## 2015-11-11 DIAGNOSIS — B360 Pityriasis versicolor: Secondary | ICD-10-CM

## 2015-11-11 NOTE — Telephone Encounter (Signed)
Relation to pt: self Call back number:[phone] Pharmacy: [id number] [place], [place] - [address] [phone] (Phone) [phone] (Fax)     Reason for call:  Patient is back in town and stated there was some confusion regarding he's hydrOXYzine (ATARAX/VISTARIL) 10 MG tablet and Itraconazole 200 MG TABS and would like prescription to go to [facility] [place].

## 2015-11-11 NOTE — Telephone Encounter (Signed)
Both were last filled by Marcelline MatesWilliam Martin PA-C Last Office visit by PCP on 10/15/2014 Last office visit by Marcelline MatesWIlliam Martin PA-C Advise on refill request. No upcoming appointments scheduled at this time.

## 2015-11-13 NOTE — Telephone Encounter (Signed)
Am willing to refill both meds once with same sig and same number but that is all, they both have increased risk if used too long. Also needs an appt has not been seen in over a year. This kind of rash is very hard to treat. Can also refer to dermatology

## 2015-11-14 ENCOUNTER — Telehealth: Payer: Self-pay | Admitting: Family Medicine

## 2015-11-14 MED ORDER — HYDROXYZINE HCL 10 MG PO TABS: 10.0000 mg | ORAL_TABLET | Freq: Three times a day (TID) | ORAL | 0 refills | Status: DC | PRN

## 2015-11-14 MED ORDER — ITRACONAZOLE 200 MG PO TABS: 200.0000 mg | ORAL_TABLET | Freq: Every day | ORAL | 0 refills | Status: DC

## 2015-11-14 NOTE — Telephone Encounter (Signed)
OK to make requested change

## 2015-11-14 NOTE — Telephone Encounter (Signed)
Pharmacist called to inform itraconazole 100 mg to take bid is much cheaper than itraconazole 200 mg.  Is it ok to change prescription from the 200 mg to the 100 mg bid? Pharmacy called back contact information is [phone] attn:[facility]

## 2015-11-14 NOTE — Telephone Encounter (Signed)
Sent in as instructed, with reminder to schedule appt. No more refills available

## 2015-11-15 ENCOUNTER — Other Ambulatory Visit: Payer: Self-pay | Admitting: Family Medicine

## 2015-11-15 MED ORDER — ITRACONAZOLE 100 MG PO CAPS: 100.0000 mg | ORAL_CAPSULE | Freq: Two times a day (BID) | ORAL | 0 refills | Status: DC

## 2015-11-15 NOTE — Telephone Encounter (Signed)
Prescription changed and sent in new one

## 2015-11-23 NOTE — Telephone Encounter (Signed)
Patient has still not received the prescription for this new medication. He stated that it was at a pharmacy in [place] but he would like it to go to,  [facility], [place], [place]   Please advise.

## 2015-11-23 NOTE — Telephone Encounter (Addendum)
[facility] [place], spoke w/ Dahlia ClientHannah. They will have rx transferred from [facility] [place] to them.

## 2016-04-02 ENCOUNTER — Telehealth: Payer: Self-pay | Admitting: Family Medicine

## 2016-04-02 MED ORDER — BENZONATATE 100 MG PO CAPS: ORAL_CAPSULE | ORAL | 1 refills | Status: DC

## 2016-04-02 NOTE — Telephone Encounter (Signed)
Medication sent to Rx. Patient made aware.  PC

## 2016-04-02 NOTE — Telephone Encounter (Signed)
Ok to rx Benzonatate 100 mg caps, 1-2 caps po tid prn cough, disp #30 with 1 rf

## 2016-04-02 NOTE — Telephone Encounter (Signed)
Patient called stating that [facility] flu like symptoms. He states that he tested negative for the flu and the provider thought that it was a viral bug. He is unable to get rid of the cough at all, he is taking musunex DM and he is unable to sleep at night he is coughing so much. He would like to know if he could get a cough medication prescribed? Please advise  Phone: [phone]  Pharmacy: [id number] [place], [place] - [address]

## 2016-04-04 ENCOUNTER — Encounter: Payer: Self-pay | Admitting: Family Medicine

## 2016-04-04 ENCOUNTER — Ambulatory Visit (INDEPENDENT_AMBULATORY_CARE_PROVIDER_SITE_OTHER): Payer: 59 | Admitting: Family Medicine

## 2016-04-04 VITALS — BP 100/58 | HR 81 | Temp 98.1°F | Ht 74.0 in | Wt 207.4 lb

## 2016-04-04 DIAGNOSIS — B9689 Other specified bacterial agents as the cause of diseases classified elsewhere: Secondary | ICD-10-CM

## 2016-04-04 DIAGNOSIS — J208 Acute bronchitis due to other specified organisms: Secondary | ICD-10-CM

## 2016-04-04 MED ORDER — AZITHROMYCIN 250 MG PO TABS: ORAL_TABLET | ORAL | 0 refills | Status: DC

## 2016-04-04 NOTE — Patient Instructions (Signed)
Continue to push fluids, practice good hand hygiene, and cover your mouth if you cough.  If you start having fevers, shaking, or worsening symptoms/shortness of breath, let us know or seek care.

## 2016-04-04 NOTE — Progress Notes (Signed)
Chief Complaint  Patient presents with  . Cough    dry-since over 1 week    Benna Dunksavid Callander here for URI complaints.  Duration: 1 week  Associated symptoms: Dry cough, fever (up to 100.4 F), shaking one night, ST from cough, slight runny nose, slight nasal congestion Denies: shortness of breath and myalgia Treatment to date: Mucinex, Tessalon Perles Sick contacts: Yes  ROS:  Const: +fevers HEENT: As noted in HPI Lungs: No SOB  Past Medical History:  Diagnosis Date  . Abdominal wall hernia 03/26/2012  . Conjunctivitis 04/01/2013  . GERD (gastroesophageal reflux disease)   . Hyperlipidemia   . Preventative health care 03/26/2012   Family History  Problem Relation Age of Onset  . Heart disease Mother   . Diabetes Mother     type 2  . Glaucoma Mother   . Heart disease Father 4055    open heart surgery  . Hyperlipidemia Father   . Heart disease Maternal Grandmother   . Heart disease Paternal Grandmother   . Heart disease Paternal Grandfather   . Hyperlipidemia Sister   . Hyperlipidemia Sister   . Heart disease Paternal Uncle     BP (!) 100/58 (BP Location: Left Arm, Patient Position: Sitting, Cuff Size: Large)   Pulse 81   Temp 98.1 F (36.7 C) (Oral)   Ht 6\' 2"  (1.88 m)   Wt 207 lb 6.4 oz (94.1 kg)   SpO2 98%   BMI 26.63 kg/m  General: Awake, alert, appears stated age HEENT: AT, [place], ears patent b/l and TM's neg, nares patent w/o discharge, pharynx pink and without exudates, MMM Neck: No masses or asymmetry Heart: RRR, no murmurs, no bruits Lungs: CTAB, no accessory muscle use Psych: Age appropriate judgment and insight, normal mood and affect  Acute bacterial bronchitis - Plan: azithromycin (ZITHROMAX) 250 MG tablet  Orders as above. Given fevers and worsening symptoms, will tx.  Benadryl at night. Continue to push fluids, practice good hand hygiene, cover mouth when coughing. Did discuss post-infectious cough which can last 4-6 weeks after illness as  resolved. F/u prn. Pt voiced understanding and agreement to the plan.  Jilda Rocheicholas Paul HoultonWendling, DO 04/04/16 10:47 AM

## 2016-07-31 ENCOUNTER — Encounter: Payer: 59 | Admitting: Family Medicine

## 2016-08-10 ENCOUNTER — Telehealth: Payer: Self-pay

## 2016-08-10 NOTE — Telephone Encounter (Signed)
Called patient regarding call he made to [employer]. Stated he has a tick bite which he described as having a a bullseye appearance around it. Patient states [place] [facility] [place] [place]. Schedule does have some opening. Called patient back to schedule appointment answering machine came on. Unable to leave message because e answering machine is full.  Called secondary number memory full unable to leave message.

## 2016-10-18 ENCOUNTER — Ambulatory Visit (INDEPENDENT_AMBULATORY_CARE_PROVIDER_SITE_OTHER): Payer: 59 | Admitting: Family Medicine

## 2016-10-18 ENCOUNTER — Telehealth: Payer: Self-pay | Admitting: Family Medicine

## 2016-10-18 ENCOUNTER — Encounter: Payer: Self-pay | Admitting: Internal Medicine

## 2016-10-18 ENCOUNTER — Encounter: Payer: Self-pay | Admitting: Family Medicine

## 2016-10-18 VITALS — BP 102/72 | HR 72 | Temp 97.7°F | Ht 74.0 in | Wt 206.8 lb

## 2016-10-18 DIAGNOSIS — Z1211 Encounter for screening for malignant neoplasm of colon: Secondary | ICD-10-CM | POA: Diagnosis not present

## 2016-10-18 DIAGNOSIS — L989 Disorder of the skin and subcutaneous tissue, unspecified: Secondary | ICD-10-CM | POA: Diagnosis not present

## 2016-10-18 DIAGNOSIS — E782 Mixed hyperlipidemia: Secondary | ICD-10-CM

## 2016-10-18 DIAGNOSIS — Z0000 Encounter for general adult medical examination without abnormal findings: Secondary | ICD-10-CM

## 2016-10-18 DIAGNOSIS — K219 Gastro-esophageal reflux disease without esophagitis: Secondary | ICD-10-CM | POA: Diagnosis not present

## 2016-10-18 DIAGNOSIS — E669 Obesity, unspecified: Secondary | ICD-10-CM | POA: Diagnosis not present

## 2016-10-18 DIAGNOSIS — M25552 Pain in left hip: Secondary | ICD-10-CM | POA: Insufficient documentation

## 2016-10-18 DIAGNOSIS — E785 Hyperlipidemia, unspecified: Secondary | ICD-10-CM

## 2016-10-18 NOTE — Telephone Encounter (Signed)
Orders placed    pc

## 2016-10-18 NOTE — Telephone Encounter (Signed)
Pt scheduled his cpe for 6 months. Pt will be coming in before the apt to have labs because his apt is late in the day. Please have orders placed for labs

## 2016-10-18 NOTE — Patient Instructions (Signed)
Red Yeast Rice for cholesterol, co q 10 enzyme with [employer], [email or web address] Cholesterol Cholesterol is a white, waxy, fat-like substance that is needed by the human body in small amounts. The liver makes all the cholesterol we need. Cholesterol is carried from the liver by the blood through the blood vessels. Deposits of cholesterol (plaques) may build up on blood vessel (artery) walls. Plaques make the arteries narrower and stiffer. Cholesterol plaques increase the risk for heart attack and stroke. You cannot feel your cholesterol level even if it is very high. The only way to know that it is high is to have a blood test. Once you know your cholesterol levels, you should keep a record of the test results. Work with your health care provider to keep your levels in the desired range. What do the results mean?  Total cholesterol is a rough measure of all the cholesterol in your blood.  LDL (low-density lipoprotein) is the "bad" cholesterol. This is the type that causes plaque to build up on the artery walls. You want this level to be low.  HDL (high-density lipoprotein) is the "good" cholesterol because it cleans the arteries and carries the LDL away. You want this level to be high.  Triglycerides are fat that the body can either burn for energy or store. High levels are closely linked to heart disease. What are the desired levels of cholesterol?  Total cholesterol below 200.  LDL below 100 for people who are at risk, below 70 for people at very high risk.  HDL above 40 is good. A level of 60 or higher is considered to be protective against heart disease.  Triglycerides below 150. How can I lower my cholesterol? Diet Follow your diet program as told by your health care provider.  Choose fish or white meat chicken and [place], roasted or baked. Limit fatty cuts of red meat, fried foods, and processed meats, such as sausage and lunch meats.  Eat lots of fresh fruits and  vegetables.  Choose whole grains, beans, pasta, potatoes, and cereals.  Choose olive oil, corn oil, or canola oil, and use only small amounts.  Avoid butter, mayonnaise, shortening, or palm kernel oils.  Avoid foods with trans fats.  Drink skim or nonfat milk and eat low-fat or nonfat yogurt and cheeses. Avoid whole milk, cream, ice cream, egg yolks, and full-fat cheeses.  Healthier desserts include angel food cake, ginger snaps, animal crackers, hard candy, popsicles, and low-fat or nonfat frozen yogurt. Avoid pastries, cakes, pies, and cookies.  Exercise  Follow your exercise program as told by your health care provider. A regular program: ? Helps to decrease LDL and raise HDL. ? Helps with weight control.  Do things that increase your activity level, such as gardening, walking, and taking the stairs.  Ask your health care provider about ways that you can be more active in your daily life.  Medicine  Take over-the-counter and prescription medicines only as told by your health care provider. ? Medicine may be prescribed by your health care provider to help lower cholesterol and decrease the risk for heart disease. This is usually done if diet and exercise have failed to bring down cholesterol levels. ? If you have several risk factors, you may need medicine even if your levels are normal.  This information is not intended to replace advice given to you by your health care provider. Make sure you discuss any questions you have with your health care provider. Document Released: 11/07/2000 Document Revised: 09/10/2015  Document Reviewed: 08/13/2015 Elsevier Interactive Patient Education  2017 [employer].

## 2016-10-18 NOTE — Assessment & Plan Note (Addendum)
Gastroenterology referral placed

## 2016-10-19 LAB — LIPID PANEL
Cholesterol: 210 mg/dL — ABNORMAL HIGH (ref 0–200)
HDL: 41.5 mg/dL (ref >39.00)
LDL Cholesterol: 148 mg/dL — ABNORMAL HIGH (ref 0–99)
NONHDL: 168
TRIGLYCERIDES: 101 mg/dL (ref 0.0–149.0)
Total CHOL/HDL Ratio: 5
VLDL: 20.2 mg/dL (ref 0.0–40.0)

## 2016-10-19 LAB — COMPREHENSIVE METABOLIC PANEL
ALT: 26 U/L (ref 0–53)
AST: 22 U/L (ref 0–37)
Albumin: 4.3 g/dL (ref 3.5–5.2)
Alkaline Phosphatase: 58 U/L (ref 39–117)
BILIRUBIN TOTAL: 1.3 mg/dL — AB (ref 0.2–1.2)
BUN: 13 mg/dL (ref 6–23)
CALCIUM: 9.4 mg/dL (ref 8.4–10.5)
CO2: 29 meq/L (ref 19–32)
Chloride: 102 mEq/L (ref 96–112)
Creatinine, Ser: 0.98 mg/dL (ref 0.40–1.50)
GFR: 85.11 mL/min (ref >60.00)
Glucose, Bld: 85 mg/dL (ref 70–99)
POTASSIUM: 4.4 meq/L (ref 3.5–5.1)
Sodium: 138 mEq/L (ref 135–145)
Total Protein: 7.1 g/dL (ref 6.0–8.3)

## 2016-10-19 LAB — CBC
HEMATOCRIT: 45.2 % (ref 39.0–52.0)
HEMOGLOBIN: 15.2 g/dL (ref 13.0–17.0)
MCHC: 33.6 g/dL (ref 30.0–36.0)
MCV: 95.1 fl (ref 78.0–100.0)
PLATELETS: 259 10*3/uL (ref 150.0–400.0)
RBC: 4.76 Mil/uL (ref 4.22–5.81)
RDW: 13.6 % (ref 11.5–15.5)
WBC: 6.3 10*3/uL (ref 4.0–10.5)

## 2016-10-19 LAB — TSH: TSH: 1.61 u[IU]/mL (ref 0.35–4.50)

## 2016-10-21 ENCOUNTER — Encounter: Payer: Self-pay | Admitting: Family Medicine

## 2016-10-21 DIAGNOSIS — E669 Obesity, unspecified: Secondary | ICD-10-CM

## 2016-10-21 DIAGNOSIS — L989 Disorder of the skin and subcutaneous tissue, unspecified: Secondary | ICD-10-CM | POA: Insufficient documentation

## 2016-10-21 HISTORY — DX: Obesity, unspecified: E66.9

## 2016-10-21 NOTE — Assessment & Plan Note (Signed)
Encouraged DASH diet, decrease po intake and increase exercise as tolerated. Needs 7-8 hours of sleep nightly. Avoid trans fats, eat small, frequent meals every 4-5 hours with lean proteins, complex carbs and healthy fats. Minimize simple carbs 

## 2016-10-21 NOTE — Assessment & Plan Note (Signed)
Hurt it missing a step several months ago. Still hurts all these months later, referred back to sports med.

## 2016-10-21 NOTE — Assessment & Plan Note (Signed)
Avoid offending foods, start probiotics. Do not eat large meals in late evening and consider raising head of bed.  

## 2016-10-21 NOTE — Progress Notes (Signed)
Patient ID: Jack Stuart, male   DOB: 1965/05/19, 51 y.o.   MRN: [id number]   Subjective:    Patient ID: Jack Stuart, male    DOB: Feb 25, 1966, 51 y.o.   MRN: [id number]  Chief Complaint  Patient presents with  . skin tag    Patient is C/O a skin tag on his chest that has grown in size over the past couple of months.    HPI Patient is in today for follow up. He is noting some trouble with his left hip. He sprained it on a step months ago. No radicular symptoms or incontinence. He is noting occasional heartburn still. Better if he avoids offending foods. He has not been able to exercise due to the pain. no recent febrile illness or hospitalization. Denies CP/palp/SOB/HA/congestion/fevers/GI or GU c/o. Taking meds as prescribed.   Past Medical History:  Diagnosis Date  . Abdominal wall hernia 03/26/2012  . Conjunctivitis 04/01/2013  . GERD (gastroesophageal reflux disease)   . Hyperlipidemia   . Lateral epicondylitis of right elbow 10/13/2014  . Obesity 10/21/2016  . Preventative health care 03/26/2012    Past Surgical History:  Procedure Laterality Date  . CHOLECYSTECTOMY  2010  . EPIGASTRIC HERNIA REPAIR N/A 04/30/2012   Procedure: HERNIA REPAIR EPIGASTRIC ADULT;  Surgeon: Clovis Pu. Cornett, MD;  Location: [facility];  Service: General;  Laterality: N/A;  . HERNIA REPAIR     as a kid  . INSERTION OF MESH N/A 04/30/2012   Procedure: INSERTION OF MESH;  Surgeon: Clovis Pu. Cornett, MD;  Location: [facility];  Service: General;  Laterality: N/A;  . WISDOM TOOTH EXTRACTION      Family History  Problem Relation Age of Onset  . Heart disease Mother   . Diabetes Mother        type 2  . Glaucoma Mother   . Heart disease Father 36       open heart surgery  . Hyperlipidemia Father   . Heart disease Maternal Grandmother   . Heart disease Paternal Grandmother   . Heart disease Paternal Grandfather   . Hyperlipidemia Sister   . Hyperlipidemia Sister   . Heart disease Paternal Uncle     Social  History   Social History  . Marital status: Married    Spouse name: N/A  . Number of children: 2  . 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 Smoking status: Never Smoker  . Smokeless tobacco: Never Used  . Alcohol use Yes     Comment: socially  . Drug use: No  . Sexual activity: Yes   Other Topics Concern  . Not on file   Social History Narrative  . No narrative on file    Outpatient Medications Prior to Visit  Medication Sig Dispense Refill  . cholestyramine (QUESTRAN) 4 g packet TAKE 1 PACKET(MIXED AS DIRECTED) BY MOUTH 2 (TWO) TIMES DAILY. 180 packet 0  . Multiple Vitamin (MULTIVITAMIN) tablet Take 1 tablet by mouth daily.    Marland Kitchen omeprazole (PRILOSEC) 20 MG capsule Take 20 mg by mouth daily.    . vitamin C (ASCORBIC ACID) 500 MG tablet Take 500 mg by mouth daily.    Marland Kitchen azithromycin (ZITHROMAX) 250 MG tablet Take 2 tabs the first day and 1 tab daily for the remainder of the package. 6 tablet 0  . benzonatate (TESSALON) 100 MG capsule Take 1-2 cap po 3 times daily as needed for  cough 30 capsule 1  . Coenzyme Q10 (CO Q 10 PO) Take 1 tablet by mouth daily.     . hydrOXYzine (ATARAX/VISTARIL) 10 MG tablet Take 1 tablet (10 mg total) by mouth 3 (three) times daily as needed. 30 tablet 0  . itraconazole (SPORANOX) 100 MG capsule Take 1 capsule (100 mg total) by mouth 2 (two) times daily. 60 capsule 0  . KRILL OIL PO Take 1 tablet by mouth daily.    . nitroGLYCERIN (NITRODUR - DOSED IN MG/24 HR) 0.2 mg/hr patch 1/4 patch daily 30 patch 1  . Vitamin D, Ergocalciferol, (DRISDOL) 50000 units CAPS capsule Take 1 capsule (50,000 Units total) by mouth every 7 (seven) days. 8 capsule 0   No facility-administered medications prior to visit.     No Known Allergies  Review of Systems  Constitutional: Negative for fever and malaise/fatigue.  HENT: Negative for congestion.   Eyes: Negative for blurred vision.    Respiratory: Negative for shortness of breath.   Cardiovascular: Negative for chest pain, palpitations and leg swelling.  Gastrointestinal: Negative for abdominal pain, blood in stool and nausea.  Genitourinary: Negative for dysuria and frequency.  Musculoskeletal: Negative for falls.  Skin: Negative for rash.  Neurological: Negative for dizziness, loss of consciousness and headaches.  Endo/Heme/Allergies: Negative for environmental allergies.  Psychiatric/Behavioral: Negative for depression. The patient is not nervous/anxious.   All other systems reviewed and are negative.      Objective:    Physical Exam  Constitutional: He is oriented to person, place, and time. He appears well-developed and well-nourished. No distress.  HENT:  Head: Normocephalic and atraumatic.  Nose: Nose normal.  Eyes: Right eye exhibits no discharge. Left eye exhibits no discharge.  Neck: Normal range of motion. Neck supple.  Cardiovascular: Normal rate and regular rhythm.   No murmur heard. Pulmonary/Chest: Effort normal and breath sounds normal.  Abdominal: Soft. Bowel sounds are normal. There is no tenderness.  Musculoskeletal: He exhibits no edema.  Neurological: He is alert and oriented to person, place, and time.  Skin: Skin is warm and dry.  Shiny, raised lesion on upper anterior chest wall and a similar lesion on lower back. Roughly 5 mm in diameter.   Psychiatric: He has a normal mood and affect.  Nursing note and vitals reviewed.   BP 102/72 (BP Location: Left Arm, Patient Position: Sitting, Cuff Size: Large)   Pulse 72   Temp 97.7 F (36.5 C) (Oral)   Ht 6\' 2"  (1.88 m)   Wt 206 lb 12.8 oz (93.8 kg)   SpO2 98%   BMI 26.55 kg/m  Wt Readings from Last 3 Encounters:  10/18/16 206 lb 12.8 oz (93.8 kg)  04/04/16 207 lb 6.4 oz (94.1 kg)  07/08/15 210 lb 2 oz (95.3 kg)     Lab Results  Component Value Date   WBC 6.3 10/18/2016   HGB 15.2 10/18/2016   HCT 45.2 10/18/2016   PLT 259.0  10/18/2016   GLUCOSE 85 10/18/2016   CHOL 210 (H) 10/18/2016   TRIG 101.0 10/18/2016   HDL 41.50 10/18/2016   LDLDIRECT 131.0 04/06/2014   LDLCALC 148 (H) 10/18/2016   ALT 26 10/18/2016   AST 22 10/18/2016   NA 138 10/18/2016   K 4.4 10/18/2016   CL 102 10/18/2016   CREATININE 0.98 10/18/2016   BUN 13 10/18/2016   CO2 29 10/18/2016   TSH 1.61 10/18/2016    Lab Results  Component Value Date   TSH 1.61 10/18/2016  Lab Results  Component Value Date   WBC 6.3 10/18/2016   HGB 15.2 10/18/2016   HCT 45.2 10/18/2016   MCV 95.1 10/18/2016   PLT 259.0 10/18/2016   Lab Results  Component Value Date   NA 138 10/18/2016   K 4.4 10/18/2016   CO2 29 10/18/2016   GLUCOSE 85 10/18/2016   BUN 13 10/18/2016   CREATININE 0.98 10/18/2016   BILITOT 1.3 (H) 10/18/2016   ALKPHOS 58 10/18/2016   AST 22 10/18/2016   ALT 26 10/18/2016   PROT 7.1 10/18/2016   ALBUMIN 4.3 10/18/2016   CALCIUM 9.4 10/18/2016   GFR 85.11 10/18/2016   Lab Results  Component Value Date   CHOL 210 (H) 10/18/2016   Lab Results  Component Value Date   HDL 41.50 10/18/2016   Lab Results  Component Value Date   LDLCALC 148 (H) 10/18/2016   Lab Results  Component Value Date   TRIG 101.0 10/18/2016   Lab Results  Component Value Date   CHOLHDL 5 10/18/2016   No results found for: HGBA1C     Assessment & Plan:   Problem List Items Addressed This Visit    Hyperlipidemia - Primary    Encouraged heart healthy diet, increase exercise, avoid trans fats, consider a krill oil cap daily      Relevant Orders   Lipid panel (Completed)   Preventative health care   Relevant Orders   Lipid panel (Completed)   CBC (Completed)   Comprehensive metabolic panel (Completed)   TSH (Completed)   GERD (gastroesophageal reflux disease)    Avoid offending foods, start probiotics. Do not eat large meals in late evening and consider raising head of bed.       Left hip pain    Hurt it missing a step several  months ago. Still hurts all these months later, referred back to sports med.      Relevant Orders   Ambulatory referral to Sports Medicine   Colon cancer screening    Gastroenterology referral placed      Relevant Orders   Ambulatory referral to Gastroenterology   Obesity    Encouraged DASH diet, decrease po intake and increase exercise as tolerated. Needs 7-8 hours of sleep nightly. Avoid trans fats, eat small, frequent meals every 4-5 hours with lean proteins, complex carbs and healthy fats. Minimize simple carbs      Skin lesion    Has a lesion on upper chest and one on lower back referred to dermatology      Relevant Orders   Ambulatory referral to Dermatology      I have discontinued Mr. Heinlen Coenzyme Q10 (CO Q 10 PO), KRILL OIL PO, nitroGLYCERIN, Vitamin D (Ergocalciferol), hydrOXYzine, itraconazole, benzonatate, and azithromycin. I am also having him maintain his omeprazole, vitamin C, multivitamin, and cholestyramine.  No orders of the defined types were placed in this encounter.   CMA served as Neurosurgeon during this visit. History, Physical and Plan performed by medical provider. Documentation and orders reviewed and attested to.  Danise Edge, MD

## 2016-10-21 NOTE — Assessment & Plan Note (Signed)
Has a lesion on upper chest and one on lower back referred to dermatology

## 2016-10-21 NOTE — Assessment & Plan Note (Signed)
Encouraged heart healthy diet, increase exercise, avoid trans fats, consider a krill oil cap daily 

## 2016-10-25 ENCOUNTER — Ambulatory Visit (INDEPENDENT_AMBULATORY_CARE_PROVIDER_SITE_OTHER): Payer: 59 | Admitting: Family Medicine

## 2016-10-25 ENCOUNTER — Encounter: Payer: Self-pay | Admitting: Family Medicine

## 2016-10-25 DIAGNOSIS — G8929 Other chronic pain: Secondary | ICD-10-CM | POA: Diagnosis not present

## 2016-10-25 DIAGNOSIS — M25522 Pain in left elbow: Secondary | ICD-10-CM | POA: Diagnosis not present

## 2016-10-25 DIAGNOSIS — M5442 Lumbago with sciatica, left side: Secondary | ICD-10-CM | POA: Diagnosis not present

## 2016-10-25 MED ORDER — DICLOFENAC SODIUM 75 MG PO TBEC: 75.0000 mg | DELAYED_RELEASE_TABLET | Freq: Two times a day (BID) | ORAL | 1 refills | Status: DC

## 2016-10-25 NOTE — Patient Instructions (Signed)
You have SI joint dysfunction/sacroiliitis. Take diclofenac twice a day with food OR aleve 2 tabs twice a day with food for pain and inflammation - take for 7-10 days then as needed. Prednisone dose pack is a consideration. Do home stretches - pick 2-3 of these, hold for 20-30 seconds, repeat each 3 times at least twice a day. Consider physical therapy or chiropractic care ([facility] [phone] or [facility] [phone]). Injection is an option if conservative measures fail.  You have lateral epicondylitis (PIN compression syndrome can mimic this as well) Try to avoid painful activities as much as possible. Ice the area 3-4 times a day for 15 minutes at a time. Medicines as noted above. Counterforce brace as directed can help unload area - wear this regularly if it provides you with relief. Hammer rotation exercise, wrist extension exercise with 1 pound weight - 3 sets of 10 once a day.   Stretching - hold for 20-30 seconds and repeat 3 times. Consider physical therapy, injection, nitro patches if not improving. Follow up in 6 weeks.

## 2016-10-26 DIAGNOSIS — M545 Low back pain, unspecified: Secondary | ICD-10-CM | POA: Insufficient documentation

## 2016-10-26 DIAGNOSIS — M25522 Pain in left elbow: Secondary | ICD-10-CM | POA: Insufficient documentation

## 2016-10-26 NOTE — Assessment & Plan Note (Signed)
consistent with SI joint dysfunction, sacroiliitis given mechanism and today's exam.  Reassured.  Start diclofenac or aleve for 7-10 days then as needed.  Shown home exercises and stretches to do daily.  Consider PT, chiropractic care (given numbers for two sports chiros to consider), prednisone dose pack, injection if not improving.

## 2016-10-26 NOTE — Progress Notes (Signed)
PCP and consultation requested by: Bradd Canary, MD  Subjective:   HPI: Patient is a 51 y.o. male here for left low back pain, left elbow pain.  Patient reports dating back to April he accidentally stepped wrong coming out of his son's school and felt like it jarred his left posterior hip/back area. Has had soreness since then. Pain is 4/10 usually but gets up to 7/10 at worst. Occasionally taken ibuprofen. This leg can feel like it goes to sleep at times. History of prior back problems but responded to chiropractic care and rolfing. No bowel/bladder dysfunction. He also reports he was doing clean and jerk 3 weeks ago and felt pain lateral aspect of left elbow doing these. Pain 0/10 at rest, worse with motions of wrist and the elbow. Is right handed. No numbness here or skin changes, bruising, swelling.  Past Medical History:  Diagnosis Date  . Abdominal wall hernia 03/26/2012  . Conjunctivitis 04/01/2013  . GERD (gastroesophageal reflux disease)   . Hyperlipidemia   . Lateral epicondylitis of right elbow 10/13/2014  . Obesity 10/21/2016  . Preventative health care 03/26/2012    Current Outpatient Prescriptions on File Prior to Visit  Medication Sig Dispense Refill  . cholestyramine (QUESTRAN) 4 g packet TAKE 1 PACKET(MIXED AS DIRECTED) BY MOUTH 2 (TWO) TIMES DAILY. 180 packet 0  . Multiple Vitamin (MULTIVITAMIN) tablet Take 1 tablet by mouth daily.    Marland Kitchen omeprazole (PRILOSEC) 20 MG capsule Take 20 mg by mouth daily.    . vitamin C (ASCORBIC ACID) 500 MG tablet Take 500 mg by mouth daily.     No current facility-administered medications on file prior to visit.     Past Surgical History:  Procedure Laterality Date  . CHOLECYSTECTOMY  2010  . EPIGASTRIC HERNIA REPAIR N/A 04/30/2012   Procedure: HERNIA REPAIR EPIGASTRIC ADULT;  Surgeon: Clovis Pu. Cornett, MD;  Location: [facility];  Service: General;  Laterality: N/A;  . HERNIA REPAIR     as a kid  . INSERTION OF MESH N/A 04/30/2012    Procedure: INSERTION OF MESH;  Surgeon: Clovis Pu. Cornett, MD;  Location: [facility];  Service: General;  Laterality: N/A;  . WISDOM TOOTH EXTRACTION      No Known Allergies  Social History   Social History  . Marital status: Married    Spouse name: N/A  . Number of children: 2  . 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 Smoking status: Never Smoker  . Smokeless tobacco: Never Used  . Alcohol use Yes     Comment: socially  . Drug use: No  . Sexual activity: Yes   Other Topics Concern  . Not on file   Social History Narrative  . No narrative on file    Family History  Problem Relation Age of Onset  . Heart disease Mother   . Diabetes Mother        type 2  . Glaucoma Mother   . Heart disease Father 85       open heart surgery  . Hyperlipidemia Father   . Heart disease Maternal Grandmother   . Heart disease Paternal Grandmother   . Heart disease Paternal Grandfather   . Hyperlipidemia Sister   . Hyperlipidemia Sister   . Heart disease Paternal Uncle     BP 121/79   Pulse 64   Ht 6\' 2"  (1.88 m)   Wt 205 lb (  93 kg)   BMI 26.32 kg/m   Review of Systems: See HPI above.     Objective:  Physical Exam:  Gen: NAD, comfortable in exam room  Back/left hip: No gross deformity, scoliosis. TTP left SI joint.  No midline or bony TTP. FROM with mild pain on extension and left lateral rotation. Strength LEs 5/5 all muscle groups.   2+ MSRs in patellar and achilles tendons, equal bilaterally. Negative SLRs. Sensation intact to light touch bilaterally. Negative logroll bilateral hips Positive left fabers and less motion of SI joint compared to right.  Negative piriformis.  Left elbow: No gross deformity, swelling, bruising. TTP mildly lateral epicondyle and distal to this in extensors of forearm. Collateral ligaments intact. Strength 5/5 elbow flexion and extension.  Mild pain wrist  extension.  No pain 3rd digit extension, supination/pronation. Negative tinels radial and cubital tunnels. NVI distally.   Assessment & Plan:  1. Left low back pain - consistent with SI joint dysfunction, sacroiliitis given mechanism and today's exam.  Reassured.  Start diclofenac or aleve for 7-10 days then as needed.  Shown home exercises and stretches to do daily.  Consider PT, chiropractic care (given numbers for two sports chiros to consider), prednisone dose pack, injection if not improving.  2. Left elbow pain - consistent with lateral epicondylitis though discussed PIN syndrome can mimic this.  Shown home exercises and stretches to do daily.  Counterforce brace.  Diclofenac or aleve.  Consider PT, injection, nitro if not improving. F/u in 6 weeks.

## 2016-10-26 NOTE — Assessment & Plan Note (Signed)
consistent with lateral epicondylitis though discussed PIN syndrome can mimic this.  Shown home exercises and stretches to do daily.  Counterforce brace.  Diclofenac or aleve.  Consider PT, injection, nitro if not improving. F/u in 6 weeks.

## 2016-10-29 ENCOUNTER — Other Ambulatory Visit: Payer: Self-pay | Admitting: Internal Medicine

## 2016-11-30 ENCOUNTER — Ambulatory Visit (AMBULATORY_SURGERY_CENTER): Payer: Self-pay | Admitting: *Deleted

## 2016-11-30 VITALS — Ht 74.0 in | Wt 205.0 lb

## 2016-11-30 DIAGNOSIS — Z1211 Encounter for screening for malignant neoplasm of colon: Secondary | ICD-10-CM

## 2016-11-30 MED ORDER — NA SULFATE-K SULFATE-MG SULF 17.5-3.13-1.6 GM/177ML PO SOLN: ORAL | 0 refills | Status: AC

## 2016-11-30 NOTE — Progress Notes (Signed)
No allergies to eggs or soy. No problems with anesthesia.  Pt given Emmi instructions for colonoscopy  No oxygen use  No diet drug use  

## 2016-12-03 ENCOUNTER — Encounter: Payer: Self-pay | Admitting: Internal Medicine

## 2016-12-06 ENCOUNTER — Encounter: Payer: Self-pay | Admitting: Family Medicine

## 2016-12-06 ENCOUNTER — Ambulatory Visit (INDEPENDENT_AMBULATORY_CARE_PROVIDER_SITE_OTHER): Payer: 59 | Admitting: Family Medicine

## 2016-12-06 DIAGNOSIS — M5442 Lumbago with sciatica, left side: Secondary | ICD-10-CM | POA: Diagnosis not present

## 2016-12-06 DIAGNOSIS — G8929 Other chronic pain: Secondary | ICD-10-CM | POA: Diagnosis not present

## 2016-12-06 NOTE — Patient Instructions (Addendum)
Do the home exercises for another 6 weeks. Consider seeing the chiropractor(s) we talked about if you plateau in your recovery. Follow up with me as needed otherwise.  [facility] [address] [place], [name] [address] PHONE: [phone]  .  FAX: [phone]

## 2016-12-08 NOTE — Progress Notes (Signed)
PCP and consultation requested by: Mosie Lukes, MD  Subjective:   HPI: Patient is a 51 y.o. male here for left low back pain, left elbow pain.  8/30: Patient reports dating back to April he accidentally stepped wrong coming out of his son's school and felt like it jarred his left posterior hip/back area. Has had soreness since then. Pain is 4/10 usually but gets up to 7/10 at worst. Occasionally taken ibuprofen. This leg can feel like it goes to sleep at times. History of prior back problems but responded to chiropractic care and rolfing. No bowel/bladder dysfunction. He also reports he was doing clean and jerk 3 weeks ago and felt pain lateral aspect of left elbow doing these. Pain 0/10 at rest, worse with motions of wrist and the elbow. Is right handed. No numbness here or skin changes, bruising, swelling.  10/11: Patient reports he's doing well. Still with some soreness left hip, low back. Home exercise program is helping. Less radiation going down into his left leg. Taking ibuprofen occasionally. No numbness.  Past Medical History:  Diagnosis Date  . Abdominal wall hernia 03/26/2012  . Allergy   . Conjunctivitis 04/01/2013  . GERD (gastroesophageal reflux disease)   . Hyperlipidemia   . Lateral epicondylitis of right elbow 10/13/2014  . Obesity 10/21/2016  . Preventative health care 03/26/2012    Current Outpatient Prescriptions on File Prior to Visit  Medication Sig Dispense Refill  . cholestyramine (QUESTRAN) 4 GM/DOSE powder Take 4 g by mouth daily. Takes 1 half pack daily    . Multiple Vitamin (MULTIVITAMIN) tablet Take 1 tablet by mouth daily.    . Na Sulfate-K Sulfate-Mg Sulf (SUPREP BOWEL PREP KIT) 17.5-3.13-1.6 GM/180ML SOLN suprep as directed.  No substitutions 354 mL 0  . omeprazole (PRILOSEC) 20 MG capsule Take 20 mg by mouth daily.    . vitamin C (ASCORBIC ACID) 500 MG tablet Take 500 mg by mouth daily.     No current facility-administered medications on  file prior to visit.     Past Surgical History:  Procedure Laterality Date  . CHOLECYSTECTOMY  2010  . EPIGASTRIC HERNIA REPAIR N/A 04/30/2012   Procedure: HERNIA REPAIR EPIGASTRIC ADULT;  Surgeon: Joyice Faster. Cornett, MD;  Location: [facility];  Service: General;  Laterality: N/A;  . HERNIA REPAIR     as a kid  . INSERTION OF MESH N/A 04/30/2012   Procedure: INSERTION OF MESH;  Surgeon: Joyice Faster. Cornett, MD;  Location: [facility];  Service: General;  Laterality: N/A;  . WISDOM TOOTH EXTRACTION      No Known Allergies  Social History   Social History  . Marital status: Married    Spouse name: N/A  . Number of children: 2  . 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 Smoking status: Never Smoker  . Smokeless tobacco: Never Used  . Alcohol use 1.2 oz/week    2 Standard drinks or equivalent per week  . Drug use: No  . Sexual activity: Yes   Other Topics Concern  . Not on file   Social History Narrative  . No narrative on file    Family History  Problem Relation Age of Onset  . Heart disease Mother   . Diabetes Mother        type 2  . Glaucoma Mother   . Heart disease Father 41       open heart surgery  .  Hyperlipidemia Father   . Heart disease Maternal Grandmother   . Heart disease Paternal Grandmother   . Heart disease Paternal Grandfather   . Hyperlipidemia Sister   . Hyperlipidemia Sister   . Heart disease Paternal Uncle   . Colon cancer Neg Hx     BP 125/65   Ht _0  (1.88 m)   Wt 205 lb (93 kg)   BMI 26.32 kg/m   Review of Systems: See HPI above.     Objective:  Physical Exam:  Gen: NAD, comfortable in exam room  Back/left hip: No gross deformity, scoliosis. Minimal TTP left SI joint.  No midline or bony TTP. FROM. Strength LEs 5/5 all muscle groups.   2+ MSRs in patellar and achilles tendons, equal bilaterally. Negative SLRs. Sensation intact to light touch  bilaterally. Negative logroll bilateral hips Negative fabers and piriformis stretches.   Assessment & Plan:  1. Left low back pain - consistent with SI joint dysfunction and sacroiliitis.  Ibuprofen if needed.  Continue home exercises and stretches.  Given number again for chiropractor to consider.  F/u prn.  Total visit time 15 minutes - >50% of which spent on counseling.

## 2016-12-08 NOTE — Assessment & Plan Note (Signed)
consistent with SI joint dysfunction and sacroiliitis.  Ibuprofen if needed.  Continue home exercises and stretches.  Given number again for chiropractor to consider.  F/u prn.  Total visit time 15 minutes - >50% of which spent on counseling.

## 2016-12-14 ENCOUNTER — Encounter: Payer: 59 | Admitting: Internal Medicine

## 2017-02-04 ENCOUNTER — Other Ambulatory Visit: Payer: Self-pay | Admitting: Internal Medicine

## 2017-02-08 ENCOUNTER — Other Ambulatory Visit: Payer: Self-pay | Admitting: Internal Medicine

## 2017-04-23 ENCOUNTER — Encounter: Payer: 59 | Admitting: Family Medicine

## 2017-05-20 ENCOUNTER — Encounter: Payer: 59 | Admitting: Family Medicine

## 2017-05-22 ENCOUNTER — Telehealth: Payer: Self-pay | Admitting: Internal Medicine

## 2017-05-22 NOTE — Telephone Encounter (Signed)
Pt needs refill for cholestyramine sent to a new pharmacy in [place]. [facility] on [address] Phone [phone]

## 2017-05-23 NOTE — Telephone Encounter (Signed)
Patient may not have a refill - has not been seen in over 4 years and I'm not even sure he lives here anymore.

## 2017-06-26 ENCOUNTER — Telehealth: Payer: Self-pay | Admitting: Internal Medicine

## 2017-06-27 NOTE — Telephone Encounter (Signed)
Patient needs to find a new doctor in [place], or, for now, go to an urgent care.  Dr. Marina Goodell will not prescribe anything - patient has not been seen in office in 4 years.

## 2017-06-28 ENCOUNTER — Telehealth: Payer: Self-pay | Admitting: Family Medicine

## 2017-06-28 MED ORDER — CHOLESTYRAMINE 4 GM/DOSE PO POWD: 4.0000 g | Freq: Every day | ORAL | 2 refills | Status: AC

## 2017-06-28 NOTE — Telephone Encounter (Signed)
Refilled medication called patient unable to leave vmail mailbox of full

## 2017-06-28 NOTE — Telephone Encounter (Signed)
Copied from CRM [id number]. Topic: Quick Communication - Rx Refill/Question >> Jun 28, 2017 11:05 AM Maia Petties wrote: Medication: cholestyramine Lanetta Inch) 4 GM/DOSE powder - pt was getting this medication from GI previously - they will not fill because he does not have routine follow ups with them - this medicine acts as a binder for GI and he is hoping Dr. Abner Greenspan will fill - last OV 10/18/16 - pt working in [place] currently Has the patient contacted their pharmacy? No Preferred Pharmacy (with phone number or street name): [id number] [place], [place] - [address] AT [facility] [phone] (Phone) [phone] (Fax)

## 2017-06-28 NOTE — Telephone Encounter (Signed)
Please advise 

## 2017-06-28 NOTE — Telephone Encounter (Signed)
I am fine to refill this for him I use it this way on occasion please refilll using sig in his med list to the pharmacy he requests. 30 day supply with 2 rf

## 2017-12-03 ENCOUNTER — Telehealth: Payer: Self-pay | Admitting: Family Medicine

## 2017-12-03 NOTE — Telephone Encounter (Signed)
Can you call patient and let him know he needs an appt w/ pcp last visit 09/2016  Please advise  Thanks

## 2017-12-05 NOTE — Telephone Encounter (Signed)
Pt recently moved to [facility] and will have to find a pcp there. He said if need be he might need you to refill just until he finds another pcp. I let him know he had 3 refills on the prescription so hopefully he will be able to find a primary care provider within in that time frame.
